# Patient Record
Sex: Female | Born: 1935 | Race: White | Hispanic: No
Health system: Southern US, Community
[De-identification: ages and names within clinical notes are randomized; demographics above are authoritative.]

---

## 2004-03-14 ENCOUNTER — Ambulatory Visit: Payer: Self-pay | Admitting: Urology

## 2004-06-25 ENCOUNTER — Inpatient Hospital Stay: Payer: Self-pay | Admitting: General Practice

## 2004-06-25 ENCOUNTER — Other Ambulatory Visit: Payer: Self-pay

## 2004-06-30 ENCOUNTER — Encounter: Payer: Self-pay | Admitting: Internal Medicine

## 2004-07-13 ENCOUNTER — Encounter: Payer: Self-pay | Admitting: Internal Medicine

## 2004-08-21 ENCOUNTER — Encounter: Payer: Self-pay | Admitting: General Practice

## 2004-09-12 ENCOUNTER — Encounter: Payer: Self-pay | Admitting: General Practice

## 2004-10-13 ENCOUNTER — Encounter: Payer: Self-pay | Admitting: General Practice

## 2005-04-11 ENCOUNTER — Ambulatory Visit: Payer: Self-pay | Admitting: Internal Medicine

## 2006-03-25 ENCOUNTER — Ambulatory Visit: Payer: Self-pay | Admitting: Unknown Physician Specialty

## 2006-07-25 ENCOUNTER — Ambulatory Visit: Payer: Self-pay | Admitting: Internal Medicine

## 2006-09-09 ENCOUNTER — Other Ambulatory Visit: Payer: Self-pay

## 2006-09-10 ENCOUNTER — Inpatient Hospital Stay: Payer: Self-pay | Admitting: Internal Medicine

## 2006-09-14 ENCOUNTER — Encounter: Payer: Self-pay | Admitting: Internal Medicine

## 2007-04-09 ENCOUNTER — Ambulatory Visit: Payer: Self-pay | Admitting: Internal Medicine

## 2007-05-30 ENCOUNTER — Emergency Department: Payer: Self-pay | Admitting: Emergency Medicine

## 2008-03-11 ENCOUNTER — Ambulatory Visit: Payer: Self-pay | Admitting: Internal Medicine

## 2009-05-04 ENCOUNTER — Ambulatory Visit: Payer: Self-pay | Admitting: Internal Medicine

## 2009-06-18 ENCOUNTER — Inpatient Hospital Stay: Payer: Self-pay | Admitting: Internal Medicine

## 2009-09-09 ENCOUNTER — Inpatient Hospital Stay: Payer: Self-pay | Admitting: Internal Medicine

## 2009-09-14 ENCOUNTER — Inpatient Hospital Stay: Payer: Self-pay | Admitting: Internal Medicine

## 2009-10-13 ENCOUNTER — Ambulatory Visit: Payer: Self-pay | Admitting: Internal Medicine

## 2009-10-14 ENCOUNTER — Inpatient Hospital Stay: Payer: Self-pay | Admitting: Internal Medicine

## 2009-10-20 ENCOUNTER — Encounter: Payer: Self-pay | Admitting: Internal Medicine

## 2009-11-12 ENCOUNTER — Ambulatory Visit: Payer: Self-pay | Admitting: Internal Medicine

## 2009-11-12 ENCOUNTER — Encounter: Payer: Self-pay | Admitting: Internal Medicine

## 2009-11-25 ENCOUNTER — Inpatient Hospital Stay: Payer: Self-pay | Admitting: Internal Medicine

## 2009-12-13 ENCOUNTER — Encounter: Payer: Self-pay | Admitting: Internal Medicine

## 2010-02-08 ENCOUNTER — Ambulatory Visit: Payer: Self-pay | Admitting: Internal Medicine

## 2010-04-16 ENCOUNTER — Emergency Department: Payer: Self-pay | Admitting: Emergency Medicine

## 2010-09-08 ENCOUNTER — Inpatient Hospital Stay: Payer: Self-pay | Admitting: Internal Medicine

## 2010-10-26 ENCOUNTER — Emergency Department: Payer: Self-pay | Admitting: Emergency Medicine

## 2011-07-22 ENCOUNTER — Emergency Department: Payer: Self-pay | Admitting: Emergency Medicine

## 2012-03-04 IMAGING — CR DG CHEST 1V PORT
1 series · 1 of 1 positions shown · non-contrast
Comparison: none

REASON FOR EXAM: chf/pneumonia
COMMENTS:

[view not recorded]
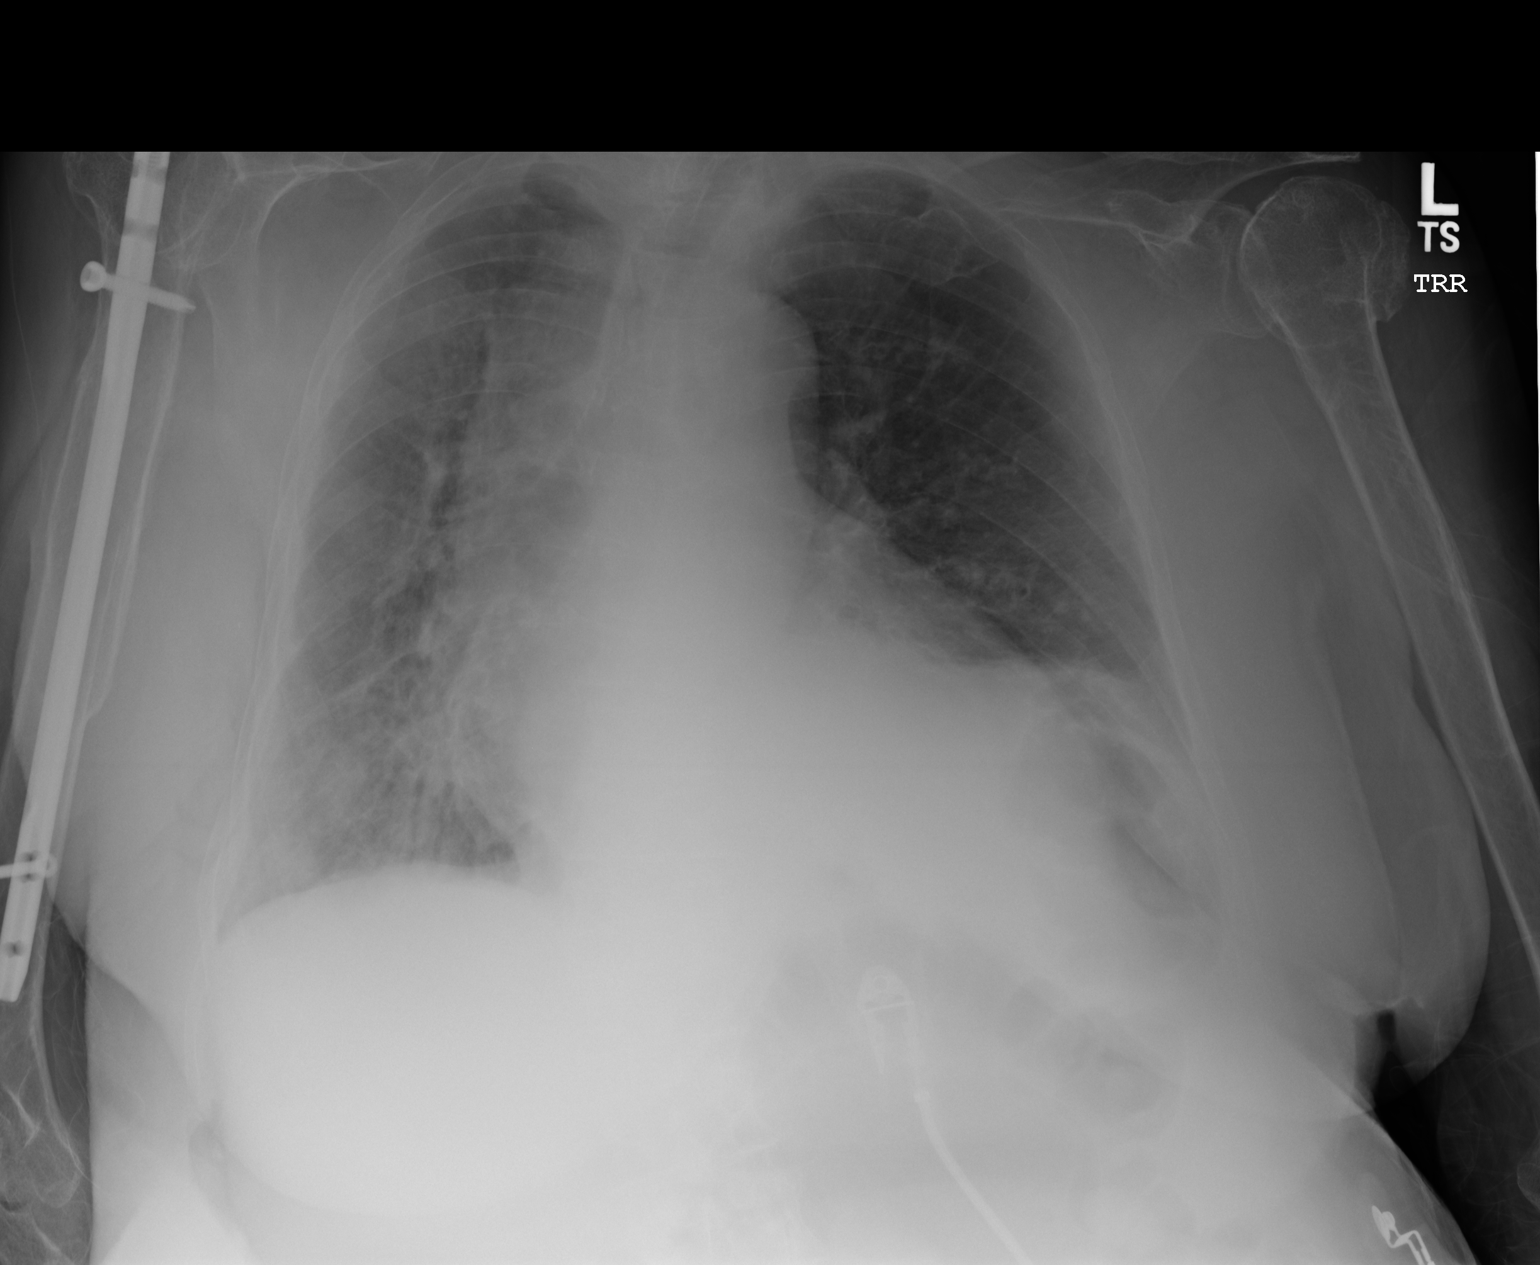

[1 of 1 positions shown; findings below may reference images not displayed]

PROCEDURE:     DXR - DXR PORTABLE CHEST SINGLE VIEW  - September 15, 2009  [DATE]

RESULT:     Comparison is made to study 14 September, 2009.

The lungs are adequately inflated. The right hemithorax remains more dense
than the left. Patchy density at the left lung base is present which may
reflect an elevated hemidiaphragm or partially intrathoracic stomach given
the gas configuration today and on yesterday's film. The cardiac silhouette
is enlarged. The pulmonary vascularity is not clearly engorged.
IMPRESSION: There is persistent increased density in the right lung in
an interstitial fashion. There is density at the left lung base which is
nonspecific and could reflect atelectasis or confluence of densities
associated with a hiatal hernia or partial intrathoracic stomach. A followup
PA and lateral chest x-ray would be of value.

## 2012-03-04 IMAGING — CR DG CHEST 2V
1 series · 2 of 2 positions shown · non-contrast
Comparison: none

REASON FOR EXAM: hypoxia
COMMENTS:

[Series 1: view not recorded · 0.17mm/px · 2 of 2 slices shown]
[im 1/2]
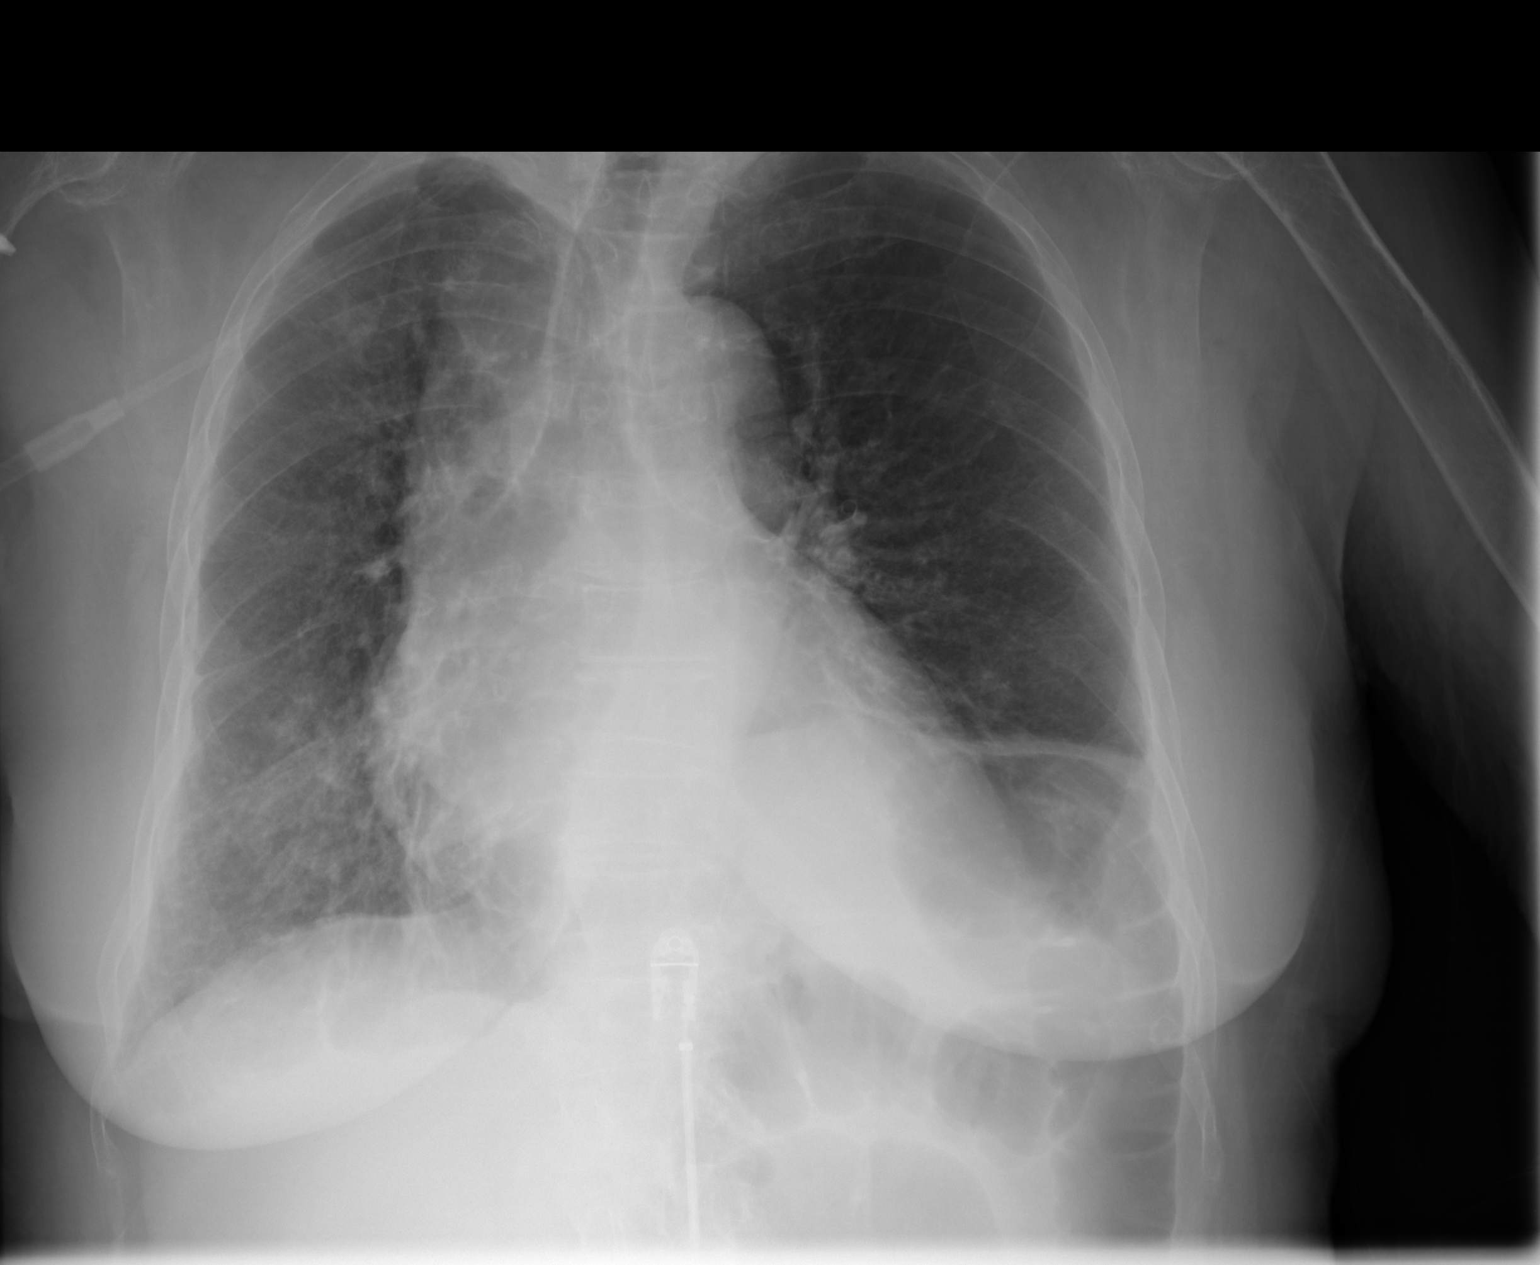
[im 2/2]
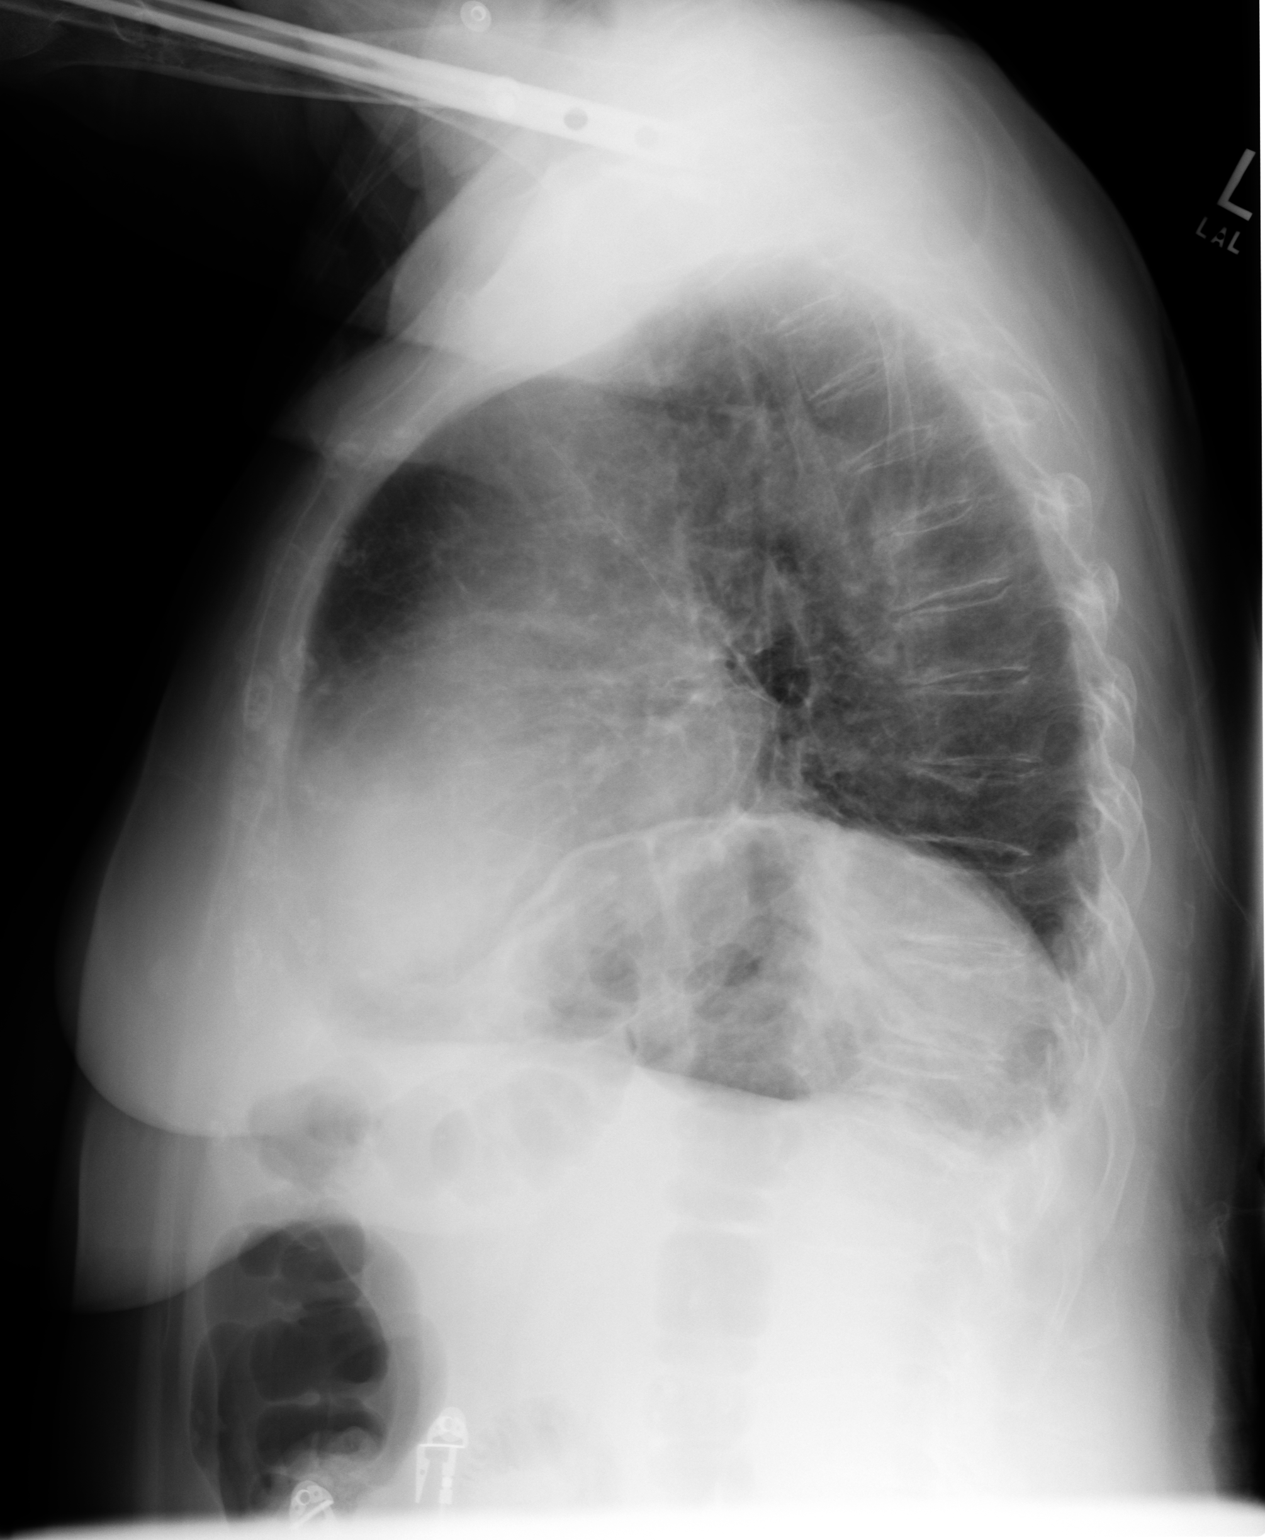

[2 of 2 positions shown; findings below may reference images not displayed]

PROCEDURE:     DXR - DXR CHEST PA (OR AP) AND LATERAL  - September 15, 2009  [DATE]

RESULT:     Comparison is made to a prior exam of earlier this day.

There persists mild thickening of the interstitial lung markings on the
right, improved as compared to the exam of 09/14/2009 and stable as compared
to the exam of earlier this day. No new infiltrates are seen. The heart is
mildly enlarged but stable as compared to prior examinations. No pleural
effusion or frank pulmonary edema is identified.
IMPRESSION: 1.  There has been improvement in the previously noted diffuse interstitial
density on the right.
2.  No new infiltrates are seen.
3.  There is chronic elevation of the left hemidiaphragm.
4.  Stable cardiomegaly.

## 2012-06-27 ENCOUNTER — Emergency Department: Payer: Self-pay | Admitting: Emergency Medicine

## 2012-06-27 LAB — CBC
HCT: 33 % — ABNORMAL LOW (ref 35.0–47.0)
MCH: 32.8 pg (ref 26.0–34.0)
MCHC: 36 g/dL (ref 32.0–36.0)
MCV: 91 fL (ref 80–100)
RBC: 3.62 10*6/uL — ABNORMAL LOW (ref 3.80–5.20)
WBC: 7.2 10*3/uL (ref 3.6–11.0)

## 2012-06-27 LAB — COMPREHENSIVE METABOLIC PANEL
Albumin: 3.6 g/dL (ref 3.4–5.0)
Alkaline Phosphatase: 73 U/L (ref 50–136)
BUN: 9 mg/dL (ref 7–18)
Bilirubin,Total: 0.4 mg/dL (ref 0.2–1.0)
Co2: 26 mmol/L (ref 21–32)
EGFR (African American): 60 — ABNORMAL LOW
EGFR (Non-African Amer.): 52 — ABNORMAL LOW
Potassium: 4.3 mmol/L (ref 3.5–5.1)
SGOT(AST): 27 U/L (ref 15–37)
SGPT (ALT): 14 U/L (ref 12–78)
Sodium: 130 mmol/L — ABNORMAL LOW (ref 136–145)

## 2012-06-27 LAB — URINALYSIS, COMPLETE
Blood: NEGATIVE
Ketone: NEGATIVE
RBC,UR: 1 /HPF (ref 0–5)
Squamous Epithelial: <1
WBC UR: 18 /HPF (ref 0–5)

## 2012-07-25 ENCOUNTER — Telehealth: Payer: Self-pay | Admitting: *Deleted

## 2012-07-25 NOTE — Telephone Encounter (Signed)
Error

## 2012-07-28 IMAGING — RF DG UGI W/O KUB
1 series · 13 of 13 positions shown · non-contrast
Comparison: none

REASON FOR EXAM: epigastric pain
COMMENTS:

[Series 1: run · 13 of 13 slices shown]
[im 1/13]
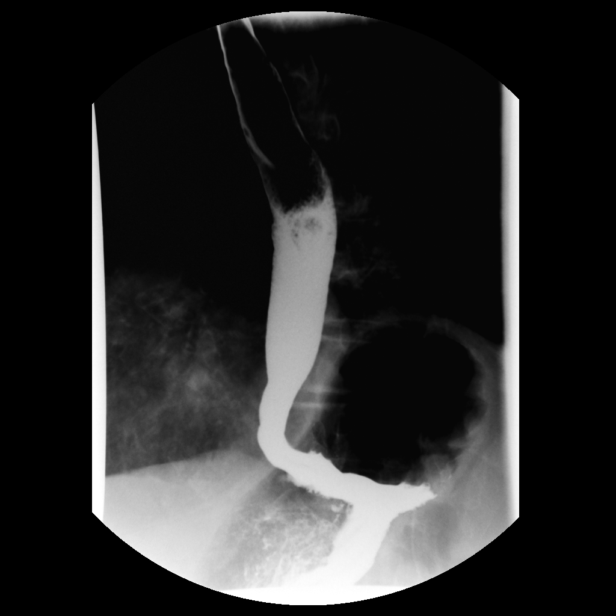
[im 2/13]
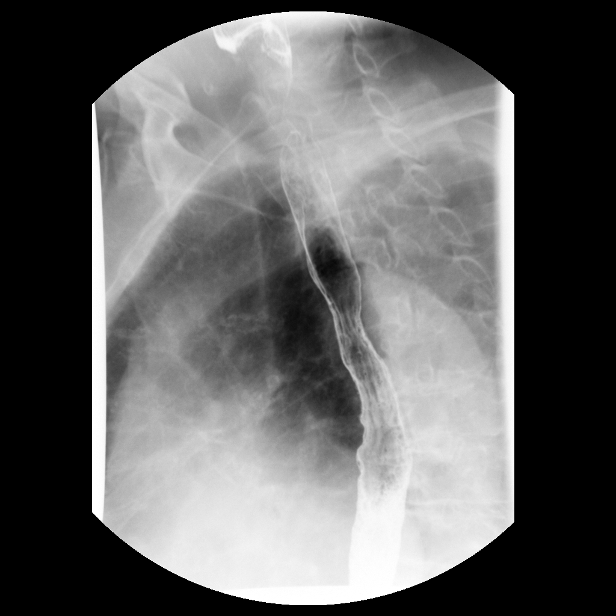
[im 3/13]
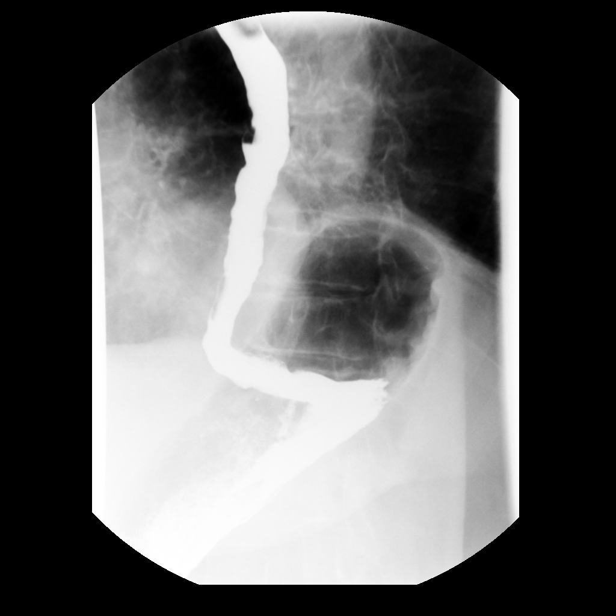
[im 4/13]
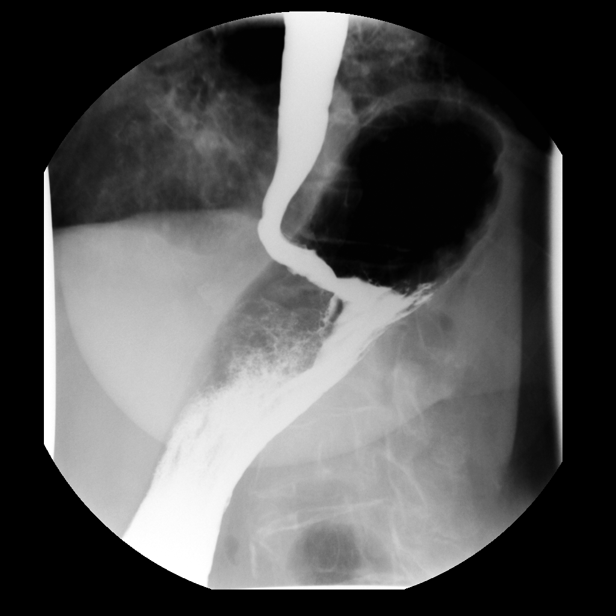
[im 5/13]
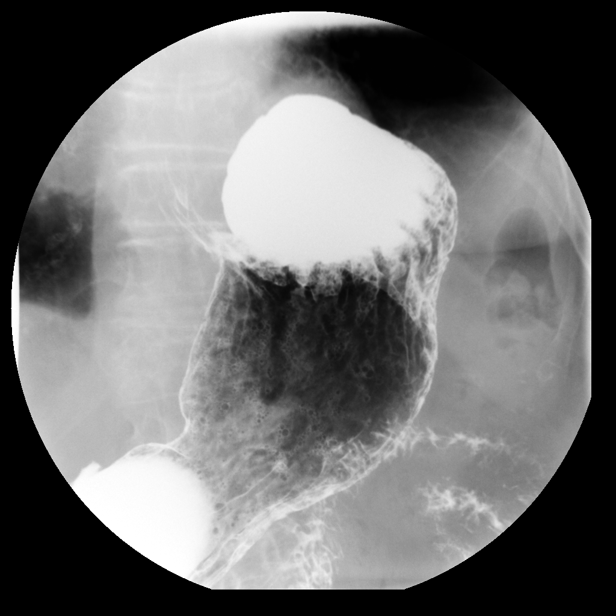
[im 6/13]
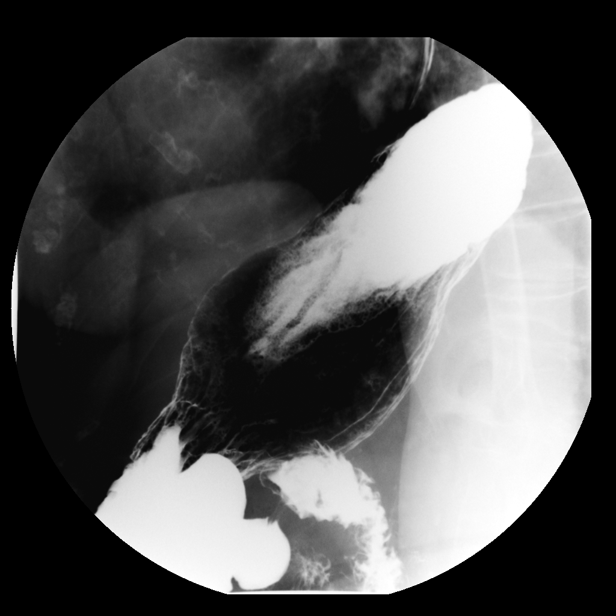
[im 7/13]
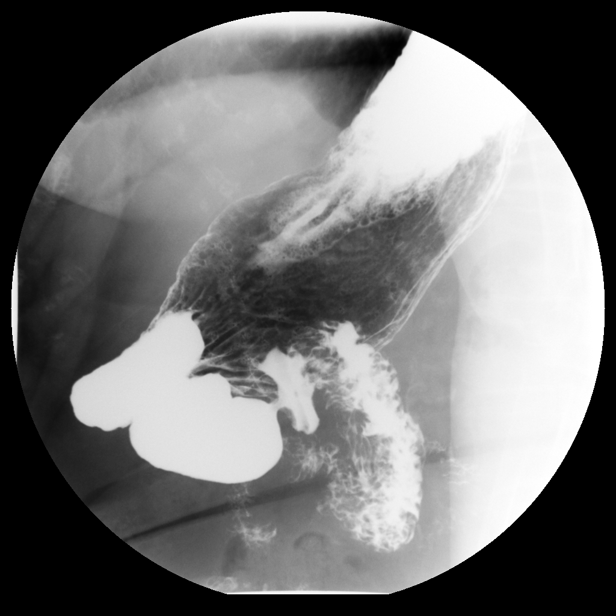
[im 8/13]
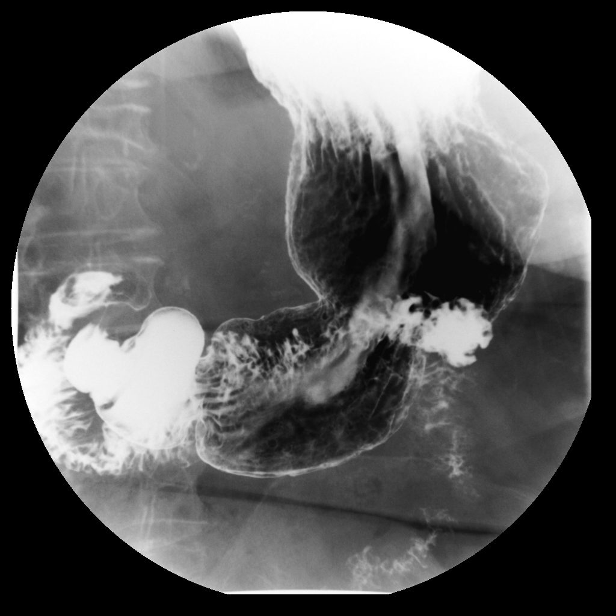
[im 9/13]
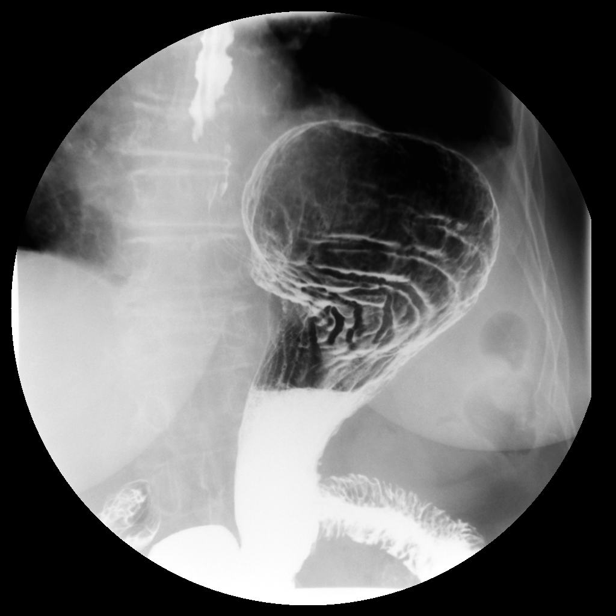
[im 10/13]
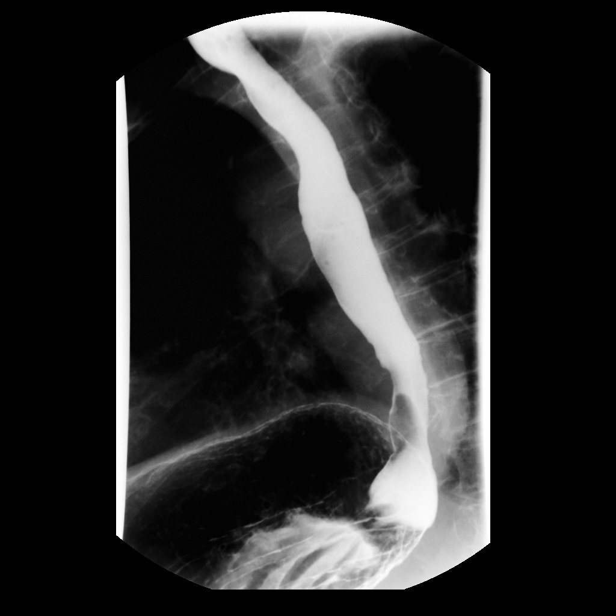
[im 11/13]
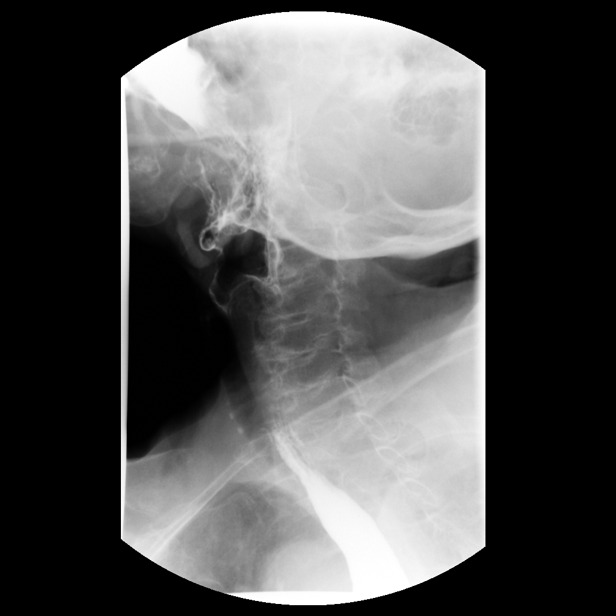
[im 12/13]
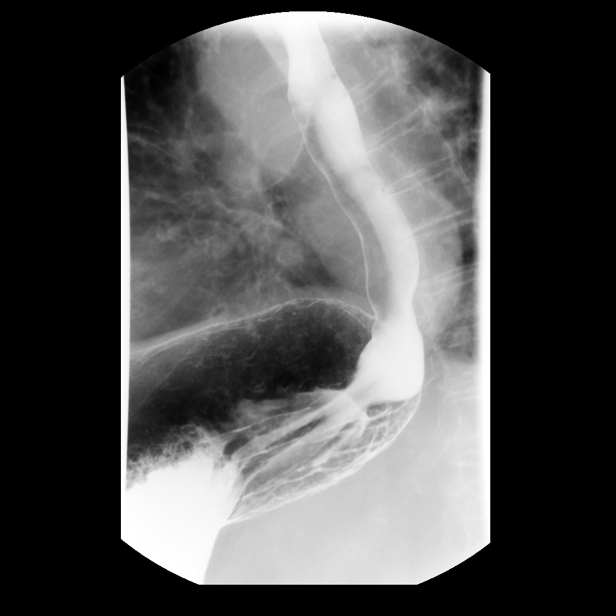
[im 13/13]
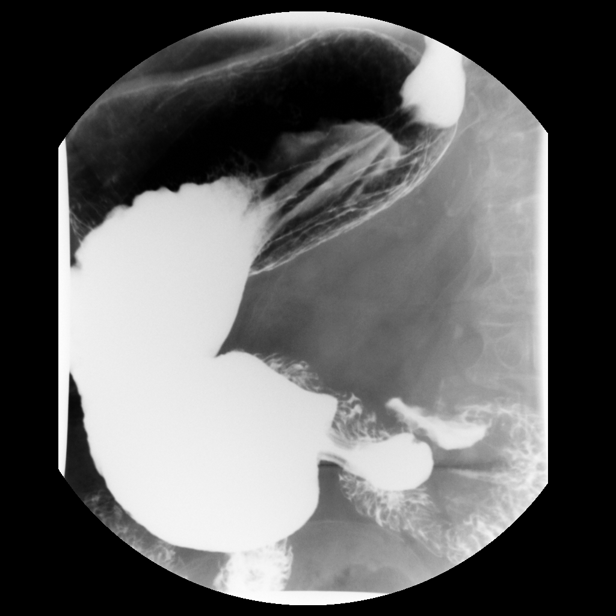

[13 of 13 positions shown; findings below may reference images not displayed]

PROCEDURE:     FL  - FL UPPER GI  - February 08, 2010  [DATE]

RESULT:     The anticipated procedure was discussed with Ms. Reynoso. She
voiced her willingness to proceed. The patient ingested barium without
difficulty. There was no laryngeal penetration of the barium. The thoracic
esophagus demonstrated very mild changes of presbyesophagus but no evidence
of stricture or esophagitis or reflux. No hiatal hernia was demonstrated.

The stomach distended well. The gastric mucosal fold pattern appeared
normal. The duodenal bulb and C-sweep were normal in appearance. The 12 mm
barium pill passed without difficulty.
IMPRESSION: 1. There are very mild changes of presbyesophagus. There is no evidence of a
stricture nor esophagitis.
2. The stomach and duodenum are normal in appearance for age.

## 2012-08-05 ENCOUNTER — Ambulatory Visit: Payer: Self-pay | Admitting: Neurology

## 2013-08-12 ENCOUNTER — Ambulatory Visit
Admit: 2013-08-12 | Disposition: A | Discharge status: Home / Self Care | Payer: Self-pay | Attending: Nurse Practitioner | Admitting: Nurse Practitioner

## 2013-08-12 ENCOUNTER — Ambulatory Visit: Payer: Self-pay | Admitting: Internal Medicine

## 2013-08-14 ENCOUNTER — Inpatient Hospital Stay: Payer: Self-pay | Admitting: Internal Medicine

## 2013-08-14 LAB — CBC
HCT: 29.5 % — ABNORMAL LOW (ref 35.0–47.0)
HGB: 10.3 g/dL — ABNORMAL LOW (ref 12.0–16.0)
MCH: 32.2 pg (ref 26.0–34.0)
MCHC: 35 g/dL (ref 32.0–36.0)
MCV: 92 fL (ref 80–100)
Platelet: 171 10*3/uL (ref 150–440)
RBC: 3.2 10*6/uL — ABNORMAL LOW (ref 3.80–5.20)
RDW: 14.1 % (ref 11.5–14.5)
WBC: 5.1 10*3/uL (ref 3.6–11.0)

## 2013-08-14 LAB — TSH: THYROID STIMULATING HORM: 4.76 u[IU]/mL — AB

## 2013-08-14 LAB — COMPREHENSIVE METABOLIC PANEL
ALBUMIN: 3.4 g/dL (ref 3.4–5.0)
ALK PHOS: 62 U/L
AST: 18 U/L (ref 15–37)
Anion Gap: 8 (ref 7–16)
BILIRUBIN TOTAL: 0.4 mg/dL (ref 0.2–1.0)
BUN: 14 mg/dL (ref 7–18)
CALCIUM: 8.3 mg/dL — AB (ref 8.5–10.1)
CHLORIDE: 98 mmol/L (ref 98–107)
Co2: 27 mmol/L (ref 21–32)
Creatinine: 1.34 mg/dL — ABNORMAL HIGH (ref 0.60–1.30)
EGFR (African American): 43 — ABNORMAL LOW
GFR CALC NON AF AMER: 37 — AB
GLUCOSE: 119 mg/dL — AB (ref 65–99)
Osmolality: 268 (ref 275–301)
POTASSIUM: 4.1 mmol/L (ref 3.5–5.1)
SGPT (ALT): 12 U/L (ref 12–78)
SODIUM: 133 mmol/L — AB (ref 136–145)
TOTAL PROTEIN: 6.3 g/dL — AB (ref 6.4–8.2)

## 2013-08-14 LAB — PROTIME-INR
INR: 0.9
PROTHROMBIN TIME: 12.5 s (ref 11.5–14.7)

## 2013-08-14 LAB — URINALYSIS, COMPLETE
BILIRUBIN, UR: NEGATIVE
Bacteria: NEGATIVE
Blood: NEGATIVE
Glucose,UR: NEGATIVE mg/dL (ref 0–75)
Ketone: NEGATIVE
Leukocyte Esterase: NEGATIVE
Nitrite: NEGATIVE
PH: 7 (ref 4.5–8.0)
Protein: NEGATIVE
RBC,UR: NONE SEEN /HPF (ref 0–5)
Specific Gravity: 1.005 (ref 1.003–1.030)

## 2013-08-14 LAB — MAGNESIUM: Magnesium: 1.8 mg/dL

## 2013-08-14 LAB — APTT: Activated PTT: 23.3 secs — ABNORMAL LOW (ref 23.6–35.9)

## 2013-08-15 LAB — CBC WITH DIFFERENTIAL/PLATELET
Basophil #: 0 10*3/uL (ref 0.0–0.1)
Basophil %: 0.3 %
EOS ABS: 0 10*3/uL (ref 0.0–0.7)
Eosinophil %: 0 %
HCT: 27.9 % — ABNORMAL LOW (ref 35.0–47.0)
HGB: 9.6 g/dL — ABNORMAL LOW (ref 12.0–16.0)
LYMPHS PCT: 8 %
Lymphocyte #: 0.7 10*3/uL — ABNORMAL LOW (ref 1.0–3.6)
MCH: 32 pg (ref 26.0–34.0)
MCHC: 34.2 g/dL (ref 32.0–36.0)
MCV: 93 fL (ref 80–100)
MONO ABS: 1.2 x10 3/mm — AB (ref 0.2–0.9)
Monocyte %: 13.6 %
NEUTROS ABS: 6.8 10*3/uL — AB (ref 1.4–6.5)
Neutrophil %: 78.1 %
Platelet: 98 10*3/uL — ABNORMAL LOW (ref 150–440)
RBC: 2.99 10*6/uL — ABNORMAL LOW (ref 3.80–5.20)
RDW: 14.4 % (ref 11.5–14.5)
WBC: 8.7 10*3/uL (ref 3.6–11.0)

## 2013-08-15 LAB — BASIC METABOLIC PANEL
Anion Gap: 9 (ref 7–16)
BUN: 12 mg/dL (ref 7–18)
CALCIUM: 7.2 mg/dL — AB (ref 8.5–10.1)
CHLORIDE: 108 mmol/L — AB (ref 98–107)
Co2: 22 mmol/L (ref 21–32)
Creatinine: 1.27 mg/dL (ref 0.60–1.30)
EGFR (African American): 46 — ABNORMAL LOW
GFR CALC NON AF AMER: 39 — AB
Glucose: 119 mg/dL — ABNORMAL HIGH (ref 65–99)
Osmolality: 278 (ref 275–301)
POTASSIUM: 4.5 mmol/L (ref 3.5–5.1)
Sodium: 139 mmol/L (ref 136–145)

## 2013-08-15 LAB — HEMOGLOBIN: HGB: 6.3 g/dL — AB (ref 12.0–16.0)

## 2013-08-15 LAB — URINE CULTURE

## 2013-08-16 LAB — BASIC METABOLIC PANEL
ANION GAP: 7 (ref 7–16)
BUN: 11 mg/dL (ref 7–18)
CALCIUM: 7.3 mg/dL — AB (ref 8.5–10.1)
CHLORIDE: 109 mmol/L — AB (ref 98–107)
CO2: 23 mmol/L (ref 21–32)
Creatinine: 1.22 mg/dL (ref 0.60–1.30)
EGFR (African American): 48 — ABNORMAL LOW
GFR CALC NON AF AMER: 41 — AB
Glucose: 99 mg/dL (ref 65–99)
Osmolality: 277 (ref 275–301)
Potassium: 4.2 mmol/L (ref 3.5–5.1)
Sodium: 139 mmol/L (ref 136–145)

## 2013-08-16 LAB — PLATELET COUNT: Platelet: 84 x10 3/mm 3 — ABNORMAL LOW

## 2013-08-16 LAB — HEMOGLOBIN: HGB: 8 g/dL — AB (ref 12.0–16.0)

## 2013-08-17 LAB — CBC WITH DIFFERENTIAL/PLATELET
Basophil #: 0 10*3/uL (ref 0.0–0.1)
Basophil %: 0.3 %
EOS PCT: 0.4 %
Eosinophil #: 0 10*3/uL (ref 0.0–0.7)
HCT: 22.5 % — ABNORMAL LOW (ref 35.0–47.0)
HGB: 7.7 g/dL — AB (ref 12.0–16.0)
LYMPHS PCT: 9.3 %
Lymphocyte #: 0.8 10*3/uL — ABNORMAL LOW (ref 1.0–3.6)
MCH: 32.1 pg (ref 26.0–34.0)
MCHC: 34.2 g/dL (ref 32.0–36.0)
MCV: 94 fL (ref 80–100)
MONO ABS: 1.3 x10 3/mm — AB (ref 0.2–0.9)
MONOS PCT: 15.3 %
Neutrophil #: 6.2 10*3/uL (ref 1.4–6.5)
Neutrophil %: 74.7 %
Platelet: 96 10*3/uL — ABNORMAL LOW (ref 150–440)
RBC: 2.4 10*6/uL — ABNORMAL LOW (ref 3.80–5.20)
RDW: 14.8 % — AB (ref 11.5–14.5)
WBC: 8.3 10*3/uL (ref 3.6–11.0)

## 2013-08-17 LAB — BASIC METABOLIC PANEL
ANION GAP: 8 (ref 7–16)
BUN: 8 mg/dL (ref 7–18)
CO2: 21 mmol/L (ref 21–32)
CREATININE: 1.24 mg/dL (ref 0.60–1.30)
Calcium, Total: 7.5 mg/dL — ABNORMAL LOW (ref 8.5–10.1)
Chloride: 108 mmol/L — ABNORMAL HIGH (ref 98–107)
EGFR (Non-African Amer.): 40 — ABNORMAL LOW
GFR CALC AF AMER: 47 — AB
Glucose: 105 mg/dL — ABNORMAL HIGH (ref 65–99)
OSMOLALITY: 273 (ref 275–301)
Potassium: 3.8 mmol/L (ref 3.5–5.1)
SODIUM: 137 mmol/L (ref 136–145)

## 2013-08-18 LAB — CBC WITH DIFFERENTIAL/PLATELET
BASOS ABS: 0 10*3/uL (ref 0.0–0.1)
BASOS PCT: 0.4 %
EOS PCT: 1.4 %
Eosinophil #: 0.1 10*3/uL (ref 0.0–0.7)
HCT: 25.1 % — ABNORMAL LOW (ref 35.0–47.0)
HGB: 8.7 g/dL — ABNORMAL LOW (ref 12.0–16.0)
LYMPHS ABS: 0.7 10*3/uL — AB (ref 1.0–3.6)
LYMPHS PCT: 9.2 %
MCH: 32.5 pg (ref 26.0–34.0)
MCHC: 34.8 g/dL (ref 32.0–36.0)
MCV: 94 fL (ref 80–100)
Monocyte #: 1 x10 3/mm — ABNORMAL HIGH (ref 0.2–0.9)
Monocyte %: 13.3 %
NEUTROS PCT: 75.7 %
Neutrophil #: 5.6 10*3/uL (ref 1.4–6.5)
PLATELETS: 101 10*3/uL — AB (ref 150–440)
RBC: 2.69 10*6/uL — AB (ref 3.80–5.20)
RDW: 14.7 % — ABNORMAL HIGH (ref 11.5–14.5)
WBC: 7.4 10*3/uL (ref 3.6–11.0)

## 2013-08-18 LAB — BASIC METABOLIC PANEL
ANION GAP: 8 (ref 7–16)
BUN: 10 mg/dL (ref 7–18)
CO2: 22 mmol/L (ref 21–32)
CREATININE: 1.03 mg/dL (ref 0.60–1.30)
Calcium, Total: 7.6 mg/dL — ABNORMAL LOW (ref 8.5–10.1)
Chloride: 109 mmol/L — ABNORMAL HIGH (ref 98–107)
EGFR (African American): 59 — ABNORMAL LOW
EGFR (Non-African Amer.): 51 — ABNORMAL LOW
Glucose: 100 mg/dL — ABNORMAL HIGH (ref 65–99)
OSMOLALITY: 277 (ref 275–301)
POTASSIUM: 3.9 mmol/L (ref 3.5–5.1)
SODIUM: 139 mmol/L (ref 136–145)

## 2013-08-19 LAB — CBC WITH DIFFERENTIAL/PLATELET
Basophil #: 0 10*3/uL (ref 0.0–0.1)
Basophil %: 0.6 %
EOS PCT: 2.7 %
Eosinophil #: 0.1 10*3/uL (ref 0.0–0.7)
HCT: 25.6 % — AB (ref 35.0–47.0)
HGB: 8.5 g/dL — AB (ref 12.0–16.0)
LYMPHS PCT: 11.3 %
Lymphocyte #: 0.6 10*3/uL — ABNORMAL LOW (ref 1.0–3.6)
MCH: 31.1 pg (ref 26.0–34.0)
MCHC: 33.3 g/dL (ref 32.0–36.0)
MCV: 93 fL (ref 80–100)
MONO ABS: 0.8 x10 3/mm (ref 0.2–0.9)
Monocyte %: 15.2 %
NEUTROS ABS: 3.8 10*3/uL (ref 1.4–6.5)
NEUTROS PCT: 70.2 %
Platelet: 127 10*3/uL — ABNORMAL LOW (ref 150–440)
RBC: 2.74 10*6/uL — ABNORMAL LOW (ref 3.80–5.20)
RDW: 15 % — AB (ref 11.5–14.5)
WBC: 5.4 10*3/uL (ref 3.6–11.0)

## 2013-08-19 LAB — BASIC METABOLIC PANEL
ANION GAP: 10 (ref 7–16)
BUN: 10 mg/dL (ref 7–18)
Calcium, Total: 7.8 mg/dL — ABNORMAL LOW (ref 8.5–10.1)
Chloride: 108 mmol/L — ABNORMAL HIGH (ref 98–107)
Co2: 20 mmol/L — ABNORMAL LOW (ref 21–32)
Creatinine: 0.89 mg/dL (ref 0.60–1.30)
EGFR (African American): >60
EGFR (Non-African Amer.): >60
Glucose: 94 mg/dL (ref 65–99)
Osmolality: 274 (ref 275–301)
POTASSIUM: 3.9 mmol/L (ref 3.5–5.1)
Sodium: 138 mmol/L (ref 136–145)

## 2013-08-20 ENCOUNTER — Encounter: Payer: Self-pay | Admitting: Internal Medicine

## 2013-08-20 LAB — CBC WITH DIFFERENTIAL/PLATELET
BASOS ABS: 0 10*3/uL (ref 0.0–0.1)
BASOS PCT: 0.4 %
EOS ABS: 0.2 10*3/uL (ref 0.0–0.7)
EOS PCT: 3.6 %
HCT: 25.7 % — AB (ref 35.0–47.0)
HGB: 8.5 g/dL — AB (ref 12.0–16.0)
Lymphocyte #: 0.5 10*3/uL — ABNORMAL LOW (ref 1.0–3.6)
Lymphocyte %: 10.2 %
MCH: 31.2 pg (ref 26.0–34.0)
MCHC: 33.1 g/dL (ref 32.0–36.0)
MCV: 94 fL (ref 80–100)
MONOS PCT: 18.2 %
Monocyte #: 0.9 x10 3/mm (ref 0.2–0.9)
Neutrophil #: 3.2 10*3/uL (ref 1.4–6.5)
Neutrophil %: 67.6 %
Platelet: 145 10*3/uL — ABNORMAL LOW (ref 150–440)
RBC: 2.73 10*6/uL — AB (ref 3.80–5.20)
RDW: 15.1 % — ABNORMAL HIGH (ref 11.5–14.5)
WBC: 4.7 10*3/uL (ref 3.6–11.0)

## 2013-08-20 LAB — BASIC METABOLIC PANEL
Anion Gap: 5 — ABNORMAL LOW (ref 7–16)
BUN: 8 mg/dL (ref 7–18)
CALCIUM: 7.6 mg/dL — AB (ref 8.5–10.1)
CO2: 25 mmol/L (ref 21–32)
Chloride: 109 mmol/L — ABNORMAL HIGH (ref 98–107)
Creatinine: 1 mg/dL (ref 0.60–1.30)
EGFR (African American): >60
EGFR (Non-African Amer.): 52 — ABNORMAL LOW
Glucose: 104 mg/dL — ABNORMAL HIGH (ref 65–99)
Osmolality: 276 (ref 275–301)
Potassium: 4.1 mmol/L (ref 3.5–5.1)
Sodium: 139 mmol/L (ref 136–145)

## 2013-08-26 LAB — URINALYSIS, COMPLETE
Bacteria: NONE SEEN
Bilirubin,UR: NEGATIVE
GLUCOSE, UR: NEGATIVE mg/dL (ref 0–75)
Ketone: NEGATIVE
Nitrite: NEGATIVE
Ph: 8 (ref 4.5–8.0)
Protein: NEGATIVE
RBC,UR: 1 /HPF (ref 0–5)
SPECIFIC GRAVITY: 1.008 (ref 1.003–1.030)
SQUAMOUS EPITHELIAL: NONE SEEN
WBC UR: 196 /HPF (ref 0–5)

## 2013-08-27 LAB — CBC WITH DIFFERENTIAL/PLATELET
BASOS ABS: 0 10*3/uL (ref 0.0–0.1)
Basophil %: 0.3 %
EOS PCT: 2.9 %
Eosinophil #: 0.2 10*3/uL (ref 0.0–0.7)
HCT: 29.6 % — ABNORMAL LOW (ref 35.0–47.0)
HGB: 9.8 g/dL — ABNORMAL LOW (ref 12.0–16.0)
Lymphocyte #: 0.5 10*3/uL — ABNORMAL LOW (ref 1.0–3.6)
Lymphocyte %: 8.6 %
MCH: 31.8 pg (ref 26.0–34.0)
MCHC: 33 g/dL (ref 32.0–36.0)
MCV: 96 fL (ref 80–100)
MONOS PCT: 12.7 %
Monocyte #: 0.7 x10 3/mm (ref 0.2–0.9)
NEUTROS ABS: 4.4 10*3/uL (ref 1.4–6.5)
Neutrophil %: 75.5 %
Platelet: 294 10*3/uL (ref 150–440)
RBC: 3.07 10*6/uL — ABNORMAL LOW (ref 3.80–5.20)
RDW: 15.8 % — AB (ref 11.5–14.5)
WBC: 5.8 10*3/uL (ref 3.6–11.0)

## 2013-08-27 LAB — BASIC METABOLIC PANEL
ANION GAP: 5 — AB (ref 7–16)
BUN: 9 mg/dL (ref 7–18)
CREATININE: 1.15 mg/dL (ref 0.60–1.30)
Calcium, Total: 8.3 mg/dL — ABNORMAL LOW (ref 8.5–10.1)
Chloride: 98 mmol/L (ref 98–107)
Co2: 31 mmol/L (ref 21–32)
EGFR (African American): 51 — ABNORMAL LOW
EGFR (Non-African Amer.): 44 — ABNORMAL LOW
GLUCOSE: 90 mg/dL (ref 65–99)
Osmolality: 266 (ref 275–301)
Potassium: 3.9 mmol/L (ref 3.5–5.1)
SODIUM: 134 mmol/L — AB (ref 136–145)

## 2013-08-28 LAB — URINE CULTURE

## 2013-09-10 LAB — CBC WITH DIFFERENTIAL/PLATELET
BASOS ABS: 0 10*3/uL (ref 0.0–0.1)
BASOS PCT: 0.3 %
EOS ABS: 0.2 10*3/uL (ref 0.0–0.7)
EOS PCT: 2.1 %
HCT: 31.2 % — ABNORMAL LOW (ref 35.0–47.0)
HGB: 10.4 g/dL — ABNORMAL LOW (ref 12.0–16.0)
LYMPHS PCT: 13.8 %
Lymphocyte #: 1 10*3/uL (ref 1.0–3.6)
MCH: 31.5 pg (ref 26.0–34.0)
MCHC: 33.4 g/dL (ref 32.0–36.0)
MCV: 94 fL (ref 80–100)
MONOS PCT: 11.9 %
Monocyte #: 0.9 x10 3/mm (ref 0.2–0.9)
Neutrophil #: 5.3 10*3/uL (ref 1.4–6.5)
Neutrophil %: 71.9 %
Platelet: 260 10*3/uL (ref 150–440)
RBC: 3.31 10*6/uL — ABNORMAL LOW (ref 3.80–5.20)
RDW: 14.8 % — ABNORMAL HIGH (ref 11.5–14.5)
WBC: 7.5 10*3/uL (ref 3.6–11.0)

## 2013-09-12 ENCOUNTER — Ambulatory Visit
Admit: 2013-09-12 | Disposition: A | Discharge status: Home / Self Care | Payer: Self-pay | Attending: Nurse Practitioner | Admitting: Nurse Practitioner

## 2013-09-12 ENCOUNTER — Ambulatory Visit: Payer: Self-pay | Admitting: Internal Medicine

## 2013-09-12 ENCOUNTER — Encounter: Payer: Self-pay | Admitting: Internal Medicine

## 2013-09-22 ENCOUNTER — Emergency Department: Payer: Self-pay | Admitting: Emergency Medicine

## 2013-09-22 LAB — CBC WITH DIFFERENTIAL/PLATELET
Basophil #: 0 10*3/uL (ref 0.0–0.1)
Basophil %: 0.2 %
Eosinophil #: 0.2 10*3/uL (ref 0.0–0.7)
Eosinophil %: 2.9 %
HCT: 31.1 % — ABNORMAL LOW (ref 35.0–47.0)
HGB: 10.5 g/dL — AB (ref 12.0–16.0)
LYMPHS ABS: 0.8 10*3/uL — AB (ref 1.0–3.6)
Lymphocyte %: 12.2 %
MCH: 31.6 pg (ref 26.0–34.0)
MCHC: 33.9 g/dL (ref 32.0–36.0)
MCV: 93 fL (ref 80–100)
Monocyte #: 0.7 x10 3/mm (ref 0.2–0.9)
Monocyte %: 11.4 %
NEUTROS ABS: 4.7 10*3/uL (ref 1.4–6.5)
Neutrophil %: 73.3 %
Platelet: 157 10*3/uL (ref 150–440)
RBC: 3.34 10*6/uL — ABNORMAL LOW (ref 3.80–5.20)
RDW: 14.6 % — ABNORMAL HIGH (ref 11.5–14.5)
WBC: 6.4 10*3/uL (ref 3.6–11.0)

## 2013-09-22 LAB — BASIC METABOLIC PANEL
ANION GAP: 7 (ref 7–16)
BUN: 20 mg/dL — ABNORMAL HIGH (ref 7–18)
CHLORIDE: 98 mmol/L (ref 98–107)
Calcium, Total: 8.8 mg/dL (ref 8.5–10.1)
Co2: 30 mmol/L (ref 21–32)
Creatinine: 1.15 mg/dL (ref 0.60–1.30)
EGFR (African American): 51 — ABNORMAL LOW
EGFR (Non-African Amer.): 44 — ABNORMAL LOW
Glucose: 101 mg/dL — ABNORMAL HIGH (ref 65–99)
OSMOLALITY: 273 (ref 275–301)
Potassium: 4.6 mmol/L (ref 3.5–5.1)
Sodium: 135 mmol/L — ABNORMAL LOW (ref 136–145)

## 2013-09-25 LAB — COMPREHENSIVE METABOLIC PANEL
ALBUMIN: 3.1 g/dL — AB (ref 3.4–5.0)
ALK PHOS: 130 U/L — AB
ALT: 11 U/L — AB
Anion Gap: 5 — ABNORMAL LOW (ref 7–16)
BILIRUBIN TOTAL: 0.4 mg/dL (ref 0.2–1.0)
BUN: 18 mg/dL (ref 7–18)
Calcium, Total: 8.1 mg/dL — ABNORMAL LOW (ref 8.5–10.1)
Chloride: 100 mmol/L (ref 98–107)
Co2: 32 mmol/L (ref 21–32)
Creatinine: 1.12 mg/dL (ref 0.60–1.30)
GFR CALC AF AMER: 53 — AB
GFR CALC NON AF AMER: 46 — AB
GLUCOSE: 107 mg/dL — AB (ref 65–99)
OSMOLALITY: 276 (ref 275–301)
Potassium: 4.8 mmol/L (ref 3.5–5.1)
SGOT(AST): 10 U/L — ABNORMAL LOW (ref 15–37)
SODIUM: 137 mmol/L (ref 136–145)
Total Protein: 5.5 g/dL — ABNORMAL LOW (ref 6.4–8.2)

## 2013-09-25 LAB — CBC WITH DIFFERENTIAL/PLATELET
BASOS ABS: 0 10*3/uL (ref 0.0–0.1)
Basophil %: 0.1 %
Eosinophil #: 0.2 10*3/uL (ref 0.0–0.7)
Eosinophil %: 4.3 %
HCT: 29.3 % — ABNORMAL LOW (ref 35.0–47.0)
HGB: 9.6 g/dL — ABNORMAL LOW (ref 12.0–16.0)
Lymphocyte #: 0.8 10*3/uL — ABNORMAL LOW (ref 1.0–3.6)
Lymphocyte %: 14.9 %
MCH: 31.2 pg (ref 26.0–34.0)
MCHC: 32.6 g/dL (ref 32.0–36.0)
MCV: 96 fL (ref 80–100)
MONOS PCT: 14 %
Monocyte #: 0.7 x10 3/mm (ref 0.2–0.9)
NEUTROS ABS: 3.4 10*3/uL (ref 1.4–6.5)
Neutrophil %: 66.7 %
Platelet: 155 10*3/uL (ref 150–440)
RBC: 3.07 10*6/uL — ABNORMAL LOW (ref 3.80–5.20)
RDW: 15 % — ABNORMAL HIGH (ref 11.5–14.5)
WBC: 5.1 10*3/uL (ref 3.6–11.0)

## 2013-09-25 LAB — URINALYSIS, COMPLETE
BLOOD: NEGATIVE
Bacteria: NONE SEEN
Bilirubin,UR: NEGATIVE
Glucose,UR: NEGATIVE mg/dL (ref 0–75)
Ketone: NEGATIVE
Leukocyte Esterase: NEGATIVE
Nitrite: NEGATIVE
Ph: 7 (ref 4.5–8.0)
Protein: NEGATIVE
SQUAMOUS EPITHELIAL: NONE SEEN
Specific Gravity: 1.016 (ref 1.003–1.030)
WBC UR: NONE SEEN /HPF (ref 0–5)

## 2013-09-27 LAB — URINE CULTURE

## 2013-10-13 ENCOUNTER — Encounter: Payer: Self-pay | Admitting: Internal Medicine

## 2013-10-13 ENCOUNTER — Ambulatory Visit
Admit: 2013-10-13 | Disposition: A | Discharge status: Home / Self Care | Payer: Self-pay | Attending: Nurse Practitioner | Admitting: Nurse Practitioner

## 2013-11-12 ENCOUNTER — Encounter: Payer: Self-pay | Admitting: Internal Medicine

## 2013-11-15 ENCOUNTER — Emergency Department: Payer: Self-pay | Admitting: Student

## 2013-11-15 LAB — COMPREHENSIVE METABOLIC PANEL
ALBUMIN: 3.6 g/dL (ref 3.4–5.0)
ALK PHOS: 146 U/L — AB
ANION GAP: 7 (ref 7–16)
BUN: 25 mg/dL — ABNORMAL HIGH (ref 7–18)
Bilirubin,Total: 0.4 mg/dL (ref 0.2–1.0)
CHLORIDE: 105 mmol/L (ref 98–107)
CO2: 26 mmol/L (ref 21–32)
Calcium, Total: 8.7 mg/dL (ref 8.5–10.1)
Creatinine: 1.16 mg/dL (ref 0.60–1.30)
EGFR (African American): 58 — ABNORMAL LOW
GFR CALC NON AF AMER: 48 — AB
Glucose: 93 mg/dL (ref 65–99)
Osmolality: 280 (ref 275–301)
Potassium: 5.3 mmol/L — ABNORMAL HIGH (ref 3.5–5.1)
SGOT(AST): 25 U/L (ref 15–37)
SGPT (ALT): 20 U/L
SODIUM: 138 mmol/L (ref 136–145)
TOTAL PROTEIN: 6.3 g/dL — AB (ref 6.4–8.2)

## 2013-11-15 LAB — URINALYSIS, COMPLETE
BACTERIA: NONE SEEN
Bilirubin,UR: NEGATIVE
Blood: NEGATIVE
Glucose,UR: NEGATIVE mg/dL (ref 0–75)
KETONE: NEGATIVE
Leukocyte Esterase: NEGATIVE
Nitrite: NEGATIVE
PH: 7 (ref 4.5–8.0)
Protein: NEGATIVE
SPECIFIC GRAVITY: 1.013 (ref 1.003–1.030)
Squamous Epithelial: NONE SEEN
WBC UR: 1 /HPF (ref 0–5)

## 2013-11-15 LAB — CBC
HCT: 34.8 % — ABNORMAL LOW (ref 35.0–47.0)
HGB: 11.5 g/dL — ABNORMAL LOW (ref 12.0–16.0)
MCH: 31.7 pg (ref 26.0–34.0)
MCHC: 33.1 g/dL (ref 32.0–36.0)
MCV: 96 fL (ref 80–100)
Platelet: 170 10*3/uL (ref 150–440)
RBC: 3.63 10*6/uL — AB (ref 3.80–5.20)
RDW: 14.4 % (ref 11.5–14.5)
WBC: 7.1 10*3/uL (ref 3.6–11.0)

## 2013-11-15 LAB — TROPONIN I: Troponin-I: <0.02 ng/mL

## 2013-11-24 LAB — CBC WITH DIFFERENTIAL/PLATELET
BASOS ABS: 0 10*3/uL (ref 0.0–0.1)
BASOS PCT: 0.4 %
EOS ABS: 0.2 10*3/uL (ref 0.0–0.7)
Eosinophil %: 3.9 %
HCT: 32.8 % — ABNORMAL LOW (ref 35.0–47.0)
HGB: 10.8 g/dL — ABNORMAL LOW (ref 12.0–16.0)
LYMPHS PCT: 34.1 %
Lymphocyte #: 1.9 10*3/uL (ref 1.0–3.6)
MCH: 31.8 pg (ref 26.0–34.0)
MCHC: 32.9 g/dL (ref 32.0–36.0)
MCV: 97 fL (ref 80–100)
Monocyte #: 0.7 x10 3/mm (ref 0.2–0.9)
Monocyte %: 12 %
Neutrophil #: 2.7 10*3/uL (ref 1.4–6.5)
Neutrophil %: 49.6 %
PLATELETS: 176 10*3/uL (ref 150–440)
RBC: 3.39 10*6/uL — AB (ref 3.80–5.20)
RDW: 13.8 % (ref 11.5–14.5)
WBC: 5.5 10*3/uL (ref 3.6–11.0)

## 2013-11-24 LAB — BASIC METABOLIC PANEL
ANION GAP: 4 — AB (ref 7–16)
BUN: 24 mg/dL — ABNORMAL HIGH (ref 7–18)
CHLORIDE: 106 mmol/L (ref 98–107)
CO2: 32 mmol/L (ref 21–32)
Calcium, Total: 8.3 mg/dL — ABNORMAL LOW (ref 8.5–10.1)
Creatinine: 1.11 mg/dL (ref 0.60–1.30)
EGFR (African American): >60
GFR CALC NON AF AMER: 50 — AB
Glucose: 77 mg/dL (ref 65–99)
OSMOLALITY: 286 (ref 275–301)
Potassium: 4.3 mmol/L (ref 3.5–5.1)
SODIUM: 142 mmol/L (ref 136–145)

## 2013-12-13 ENCOUNTER — Encounter: Payer: Self-pay | Admitting: Internal Medicine

## 2014-01-12 ENCOUNTER — Encounter: Payer: Self-pay | Admitting: Internal Medicine

## 2014-02-12 ENCOUNTER — Encounter: Payer: Self-pay | Admitting: Internal Medicine

## 2014-02-12 ENCOUNTER — Ambulatory Visit
Admit: 2014-02-12 | Disposition: A | Discharge status: Home / Self Care | Payer: Self-pay | Attending: Nurse Practitioner | Admitting: Nurse Practitioner

## 2014-02-18 LAB — URINE CULTURE

## 2014-03-15 ENCOUNTER — Encounter: Payer: Self-pay | Admitting: Internal Medicine

## 2014-04-13 ENCOUNTER — Encounter
Admit: 2014-04-13 | Disposition: A | Discharge status: Home / Self Care | Payer: Self-pay | Attending: Internal Medicine | Admitting: Internal Medicine

## 2014-05-14 ENCOUNTER — Encounter
Admit: 2014-05-14 | Disposition: A | Discharge status: Home / Self Care | Payer: Self-pay | Attending: Internal Medicine | Admitting: Internal Medicine

## 2014-06-05 NOTE — H&P (Signed)
PATIENT NAME:  Gina Noble, NORTHWAY MR#:  [id number] DATE OF BIRTH:  [birth year]  DATE OF ADMISSION:  08/14/2013  PRIMARY CARE PHYSICIAN: Duane Lope. Judithann Sheen, MD  REFERRING PHYSICIAN: Charlestine Night. Scotty Court, MD   CHIEF COMPLAINT: Fall and hip fracture.   HISTORY OF PRESENT ILLNESS: The patient is an 81 year old pleasant Caucasian female with past medical history of hypertension, hyperlipidemia, COPD, who is presenting to the ED after she sustained a fall. The patient was getting out of the bed to go to bathroom in the middle of the night, and then while using walker, she tripped over and lost her balance. The patient fell on the right side, and she was having right hip pain. The patient was brought into the ED and diagnosed with right hip fracture. The patient was made n.p.o., and hospitalist team is called to admit the patient regarding right hip fracture. The ER physician has paged and discussed with Dr. Ernest Pine, the on-call orthopedic. During my examination, the patient is complaining of right hip pain, and morphine was given. Two daughters are at bedside. The patient has 1 daughter who lives in [place] and is visiting the patient, and she is with her tonight. The patient usually lives alone. While in the ED, the EKG has revealed sinus bradycardia, and on the telemetry monitor, several PVCs were noted. The patient denies any palpitations or dizziness. She did not lose any consciousness when she sustained this fall. Her 2 daughters are at bedside. No other complaints.   PAST MEDICAL HISTORY: 1. Hypertension.  2. Hyperlipidemia.  3. Chronic history of CPD.  4. History of CVA x3.  5. Anxiety.  6. Depression.  7. Osteoarthritis.   PAST SURGICAL HISTORY: Left hip fracture status post repair.   ALLERGIES: THE PATIENT IS ALLERGIC TO PENICILLIN, DILANTIN, KEPPRA.   PSYCHOSOCIAL HISTORY: Lives at home, lives alone. She used to smoke, but quit smoking several years ago. She also used to drink alcohol heavily, but  she quit drinking. Denies any illicit drug usage.   FAMILY HISTORY: Mother had history of congestive heart failure.   HOME MEDICATIONS:  1. Venlafaxine extended release 1 capsule p.o. once daily.  2. Pantoprazole 40 mg p.o. once daily.  3. Mirtazapine 15 mg p.o. once a day at bedtime.  4. Metoprolol tartrate 50 mg 1/2 tablet p.o. b.i.d.  5. Lorazepam 0.5 mg p.o. q.8 hours.  6. Lamotrigine 25 mg 3 tablets p.o. once a day.  7. Plavix 75 mg p.o. once daily. 8. Benadryl 25 mg p.o. once daily. 9. Aspirin 81 mg p.o. once daily.  10. Advair 1 puff inhalation 2 times a day.   REVIEW OF SYSTEMS:  CONSTITUTIONAL: Denies any fever or fatigue.  EYES: Denies blurry vision, double vision.  ENT: Denies epistaxis, discharge, tinnitus.  RESPIRATION: Denies cough. Has chronic history of COPD.  CARDIOVASCULAR: Denies any chest pain, shortness of breath or palpitations.  GASTROINTESTINAL: Denies nausea, vomiting, diarrhea, abdominal pain, hematemesis or melena.  GENITOURINARY: No dysuria or hematuria. ENDOCRINE: Denies polyuria, nocturia, thyroid problems.  HEMATOLOGIC AND LYMPHATIC: No anemia, easy bruising or bleeding.  INTEGUMENTARY: No acne, rash, lesions.  MUSCULOSKELETAL: Complaining of right hip pain. Denies any other joint pains. No shoulder pain. No neck pain. Denies any gout.  NEUROLOGIC: Has 3 episodes of stroke in the past. Denies any ataxia. PSYCHIATRIC: Has chronic history of anxiety and depression.   PHYSICAL EXAMINATION:  VITAL SIGNS: Temperature 97.4, pulse 49, respirations 16, blood pressure 104/54, pulse oximetry 98%.  GENERAL APPEARANCE: Not under acute  distress. Moderately built and nourished.  HEENT: Normocephalic, atraumatic. Pupils are equally reacting to light and accommodation. No scleral icterus. No conjunctival injection. No sinus tenderness. No postnasal drip. Moist mucous membranes.  NECK: Supple. No JVD. No thyromegaly. Range of motion is intact.  LUNGS: Clear to  auscultation bilaterally. No accessory muscle usage. No anterior chest wall tenderness on palpation.  CARDIAC: S1 and S2 normal. Regular rate and rhythm. No murmurs.  GASTROINTESTINAL: Soft. Bowel sounds are positive in all 4 quadrants. Nontender, nondistended. No hepatosplenomegaly. No masses felt.  NEUROLOGIC: Awake, alert and oriented x3. Cranial nerves II through XII are grossly intact. Motor and sensory are intact except in the right hip area. Reflexes are 2+. EXTREMITIES: Right hip is abducted and externally rotated, tender to touch. Range of motion is limited because of the fracture. Other joints are intact.  EXTREMITIES: No edema. No cyanosis. No clubbing.  SKIN: Warm to touch. Normal turgor. No rashes. No lesions.  PSYCHIATRIC: Flat mood and affect.   LABORATORY AND IMAGING STUDIES: Chest x-ray, single view: No active disease, chronic cardiomegaly and left diaphragm elevation. Right hip x-ray: Acute intertrochanteric right femur fracture. A 12-lead EKG: Normal sinus rhythm with sinus arrhythmia at 50 beats per minute. No acute ST wave changes. Telemetry strip: Multiple PVCs were noticed. LFTs are normal except total protein is 6.3. WBC 5.1, hemoglobin 10.3, hematocrit is 29.5, platelets are 171, MCV 92. Glucose 119, BUN is normal, creatinine 1.34, sodium 133, potassium 4.1, chloride 98, CO2 27, GFR is 37, anion gap is normal. Serum osmolality 268, calcium is 8.3, magnesium is 1.8. TSH is ordered, which is pending.    ASSESSMENT AND PLAN: An 81 year old female who lives alone, who has sustained a fall while she was trying to go out of the bed as she lost her balance, sustained right hip fracture. In the ED, the patient with multiple premature ventricular contractions on the telemetry strip, and EKG has revealed sinus bradycardia. Cardiology is consulted regarding preoperative clearance. This is discussed with Dr. Ernest PineHooten, who is aware.   1. Fall with right hip fracture. Will make her n.p.o.  Provide her IV fluids. Pain management with morphine. [facility]iology [facility] for preop clearance in view of multiple premature ventricular contractions and sinus bradycardia.  2. Frequent premature ventricular contractions and bradycardia. Magnesium is normal. TSH is ordered, which is pending. [facility]iology [facility] for preop clearance.  3. Acute kidney injury. Provide IV fluids. The patient is currently n.p.o.  4. Hypertension and hyperlipidemia. Hold off on home medications as the patient is n.p.o. Will provide her antihypertensive as needed for elevated blood pressure.  5. Chronic obstructive pulmonary disease. Provide nebulizer treatments.  6. Will provide gastrointestinal prophylaxis and deep vein thrombosis prophylaxis with SCDs.   CODE STATUS: The patient is DNR. Daughters are the medical power of attorney.   Plan of care discussed with the patient and 2 daughters at bedside. They all verbalized understanding of the plan.   The patient will be transferred to Dr. Aletha HalimJeff Sparks.   TOTAL TIME SPENT ON THE ADMISSION: 50 minutes.   ____________________________ Ramonita LabAruna Carma Dwiggins, MD ag:lb D: 08/14/2013 08:01:59 ET T: 08/14/2013 08:58:47 ET JOB#: [id number]  cc: Ramonita LabAruna Abrial Arrighi, MD, <Dictator> Duane LopeJeffrey D. Judithann SheenSparks, MD Ramonita LabARUNA Trynity Skousen MD ELECTRONICALLY SIGNED 08/15/2013 0:45

## 2014-06-05 NOTE — Discharge Summary (Signed)
PATIENT NAME:  Gina Noble, Gina Noble MR#:  [id number] DATE OF BIRTH:  06-24-1928  DISCHARGE AND TRANSFER SUMMARY  DATE OF ADMISSION:  08/14/2013 DATE OF DISCHARGE:  08/20/2013   TYPE OF DISCHARGE: The patient transferred to a skilled nursing facility.   REASON FOR ADMISSION: Right hip fracture.   HISTORY OF PRESENT ILLNESS: The patient is an 81 year old female with a history of alcoholism, previous strokes, COPD and chronic dysphagia. She presented to the Emergency Room after a fall, where she was found to have a right hip fracture. She was admitted for further evaluation.   PAST MEDICAL HISTORY:  1. COPD.  2. Previous stroke.  3. History of alcohol abuse.  4. Chronic dysphagia.  5. Benign hypertension.  6. Hyperlipidemia.  7. Anxiety/depression.  8. Osteoarthritis.   MEDICATIONS ON ADMISSION: Please see admission note.   ALLERGIES: DILANTIN, KEPPRA AND PENICILLIN.   SOCIAL HISTORY, FAMILY HISTORY AND REVIEW OF SYSTEMS: As per admission note.   PHYSICAL EXAMINATION:  GENERAL: The patient was in no acute distress.  VITAL SIGNS: Stable, and she was afebrile.  HEENT: Unremarkable.  NECK: Supple without JVD.  LUNGS: Clear.  CARDIAC: Revealed a regular rate and rhythm with normal S1, S2.  ABDOMEN: Soft and nontender.  EXTREMITIES: Without edema.  NEUROLOGIC: Grossly nonfocal.   HOSPITAL COURSE: The patient was admitted with a fall and right hip fracture. She was seen by orthopedics, who recommended surgery for repair. This was done. Postoperatively, the patient developed significant anemia with a hemoglobin of 6. She was hypotensive and tachycardic. She was moved to the Intensive Care Unit, where she was transfused. Her hemoglobin improved as did her tachycardia and hypotension. There was no evidence of further bleeding. She was moved back to the orthopedics floor from the Intensive Care Unit. She was seen by physical therapy, who recommended skilled nursing placement for rehab. The patient  and family agreed. She is now transferred to the skilled nursing facility for further care and rehabilitation.   DISCHARGE DIAGNOSES:  1. Acute right hip fracture, status post surgical repair.  2. Acute blood loss anemia.  3. Hypotension, resolved.  4. Tachycardia, resolved.  5. Chronic obstructive pulmonary disease.  6. Chronic dysphagia.  7. Previous stroke x3.  8. History of alcoholism.   DISCHARGE MEDICATIONS:  1. DuoNeb SVNs q.i.d.  2. Oxygen at 2 liters per minute per nasal cannula.  3. Lopressor 25 mg p.o. b.i.d.  4. Effexor-XR 75 mg p.o. daily.  5. Tramadol 50 mg p.o. q.4 hours p.r.n. pain.  6. Protonix 40 mg p.o. b.i.d.  7. Iron sulfate 325 mg p.o. b.i.d.  8. Colace 100 mg p.o. b.i.d.  9. Benadryl 25 mg p.o. at bedtime.  10. Advair 250/50 one puff b.i.d. 11. Lamictal 75 mg p.o. at bedtime.  12. Ativan 0.5 mg p.o. q.8 hours p.r.n. anxiety. 13. Remeron 15 mg p.o. at bedtime.  14. Oxycodone 5 mg p.o. q.4 hours p.r.n. pain.   FOLLOWUP PLANS AND APPOINTMENTS: The patient will be followed by the resident physician at the skilled nursing facility. She is a no code blue, do not resuscitate. She is on a regular diet with Ensure supplements t.i.d. She will be seen in consultation by physical therapy. Will obtain a CBC and a MET-B in 1 week.   ____________________________ Leonie Douglas. Doy Hutching, MD jds:lb D: 08/20/2013 07:22:20 ET T: 08/20/2013 07:28:12 ET JOB#: [id number]  cc: Leonie Douglas. Doy Hutching, MD, <Dictator> Juwaun Inskeep Lennice Sites MD ELECTRONICALLY SIGNED 08/21/2013 12:11

## 2014-06-05 NOTE — Consult Note (Signed)
PATIENT NAME:  Gina Noble, Breunna F MR#:  [id number] DATE OF BIRTH:  February 11, 1929  DATE OF CONSULTATION:  08/14/2013  REFERRING PHYSICIAN:  Dr. Rosita KeaMenz. CONSULTING PHYSICIAN:  Lamar BlinksBruce J. Kowalski, MD  REASON FOR CONSULTATION: Preventricular contractions and bradycardia with previous cardiovascular history including stroke.   CHIEF COMPLAINT: The patient is aphasic, but did have a hip fracture.  HISTORY OF PRESENT ILLNESS: This is an 81 year old female with multiple strokes and aphasia who has had appropriate care and no current and recent evidence of congestive heart failure, angina or other significant issues. The patient has had appropriate medication management for hypertension and has had metoprolol. At that time, the patient did have some bradycardia, likely secondary to metoprolol with an EKG showing sinus bradycardia with incomplete right bundle branch block without evidence of other significant symptoms. The patient appears to be at low risk for cardiovascular complication with surgery. She has had some preventricular contractions with admission to the hospital, likely secondary to pain of her current issues, but has resolved at this time with telemetry. The patient has had multiple strokes, on appropriate medications including Plavix and aspirin, and stable at this time. She does have a history of hyperlipidemia and had not been on medication due to side effects. The patient does have anemia with hemoglobin of 10.6. Remainder of review of systems cannot be assessed due to significant aphasia.   PAST MEDICAL HISTORY:  1.  Strokes.  2.  Bradycardia.  3.  Hypertension.  4.  Hyperlipidemia.  5.  Anemia.   FAMILY HISTORY: No family members with significant cardiovascular risk factors for myocardial infarction.   SOCIAL HISTORY: The patient does not have any alcohol or tobacco use.   ALLERGIES: AS LISTED.   MEDICATIONS: As listed.   PHYSICAL EXAMINATION:  VITAL SIGNS: Blood pressure is 122/68  bilaterally, heart rate 72 upright, reclining, and regular.  GENERAL: She is a well-appearing female, aphasic.  HEAD, EYES, EARS, NOSE, AND THROAT: No icterus, thyromegaly, ulcers, hemorrhage, or xanthelasma.  CARDIOVASCULAR: Regular rate and rhythm. Normal S1 and S2 without murmur, gallop, or rub. PMI is normal size and placement. Carotid upstroke normal without bruit. Jugular venous pressure is normal.  LUNGS: Have a few expiratory wheezes.  ABDOMEN: Soft, nontender without hepatosplenomegaly or masses. Abdominal aorta is normal size without bruit.  EXTREMITIES: 2+ bilateral pulses in dorsal, pedal, radial and femoral arteries with trace lower extremity edema. No cyanosis, clubbing, or ulcers.  NEUROLOGIC: She has had some strokes.   ASSESSMENT: An 81 year old female with hypertension and bradycardia, preventricular contractions, previous vascular issues with strokes, hyperlipidemia, anemia relatively stable with no current evidence of congestive heart failure or anginal symptoms on appropriate medication management at lowest risk possible for cardiovascular complication with surgical intervention.   RECOMMENDATIONS:  1.  Continue metoprolol at low dose throughout the surgery, watching for worsening bradycardia, but will help with hypertension control.  2.  the patient may have discontinuation of Plavix to reduce bleeding complication with surgical intervention, but would remained on aspirin for further risk reduction in stroke.   3.  Continue to follow closely for any blood loss for concerns of anemia.   4.  No additional lipid management due to side effects in the past.   5.  No further concerns of preventricular contractions, which appear to be benign and likely secondary to pain.  6.  No restrictions to rehabilitation and/or postsurgical care.    ____________________________ Lamar BlinksBruce J. Kowalski, MD bjk:ts D: 08/14/2013 09:02:20 ET T: 08/14/2013 11:01:14 ET  JOB#: [id number]  cc: Lamar Blinks, MD, <Dictator> Lamar Blinks MD ELECTRONICALLY SIGNED 08/17/2013 13:22

## 2014-06-05 NOTE — Consult Note (Signed)
   Comments   I met with pt's daughters. They remain hopeful that pt can recover from her acute illness and go to SNF for STR. They are also hopeful that pt can return to her home after STR. Pt has a sitter 3 hrs a day and son and daughter see her daily.  discussed code status with daughters and they want pt to be a DNR. Order entered. Out-of-facility DNR completed and placed in chart.   Electronic Signatures: Gaytha Raybourn, Izora Gala (MD)  (Signed 06-Jul-15 12:48)  Authored: Palliative Care   Last Updated: 06-Jul-15 12:48 by Nima Kemppainen, Izora Gala (MD)

## 2014-06-05 NOTE — Consult Note (Signed)
Brief Consult Note: Diagnosis: right pertrochanteric femur fracture.   Patient was seen by consultant.   Comments: Recommend ORIF of right pertrochanteric femur fracture.  The risks and benefits of surgical intervention were discussed in detail with the patient. The patient expressed understanding of the risks and benefits and agreed with plans for surgery.   Surgical site signed as per "right site surgery" protocol.  Electronic Signatures: Donato HeinzHooten, James P (MD)  (Signed 03-Jul-15 09:53)  Authored: Brief Consult Note   Last Updated: 03-Jul-15 09:53 by Donato HeinzHooten, James P (MD)

## 2014-06-05 NOTE — Op Note (Signed)
PATIENT NAME:  Gina Noble, BRESS MR#:  [id number] DATE OF BIRTH:  [birth year]  DATE OF PROCEDURE:  08/14/2013  PREOPERATIVE DIAGNOSIS: Right peritrochanteric femur fracture.   POSTOPERATIVE DIAGNOSIS: Right peritrochanteric femur fracture.   PROCEDURE PERFORMED: Open reduction and internal fixation of right peritrochanteric femur fracture.   SURGEON: Francesco Sor, MD.   ANESTHESIA: General.   ESTIMATED BLOOD LOSS: 200 mL.   FLUIDS REPLACED: 1500 mL of crystalloid.   DRAINS: None.   IMPLANTS UTILIZED: Synthes 130 degrees 11 x 340 mm trochanteric fixation nail, 95 mm helical blade, and 5 x 42 mm locking bolt.   INDICATIONS FOR SURGERY: The patient is an 81 year old female who fell out of bed on the date of admission and landed on her right hip and side. She was unable to stand or bear weight due to the pain. X-rays demonstrated a comminuted right peritrochanteric femur fracture. The patient was cleared for surgery by internal medicine and cardiology. After discussion of the risks and benefits of surgical intervention, the patient expressed understanding of the risks, benefits, and agreed with plans for surgical intervention.   PROCEDURE IN DETAIL: The patient was brought into Operating Room and, after adequate general anesthesia was achieved, the patient was transferred to fracture table. Traction was applied to the right lower extremity and provisional closed reduction was performed with acceptable position noted in both AP and lateral planes using the C-arm. All bony prominences were well padded. The patient's right hip and leg were cleaned and prepped with alcohol and DuraPrep and draped in the usual sterile fashion. A "timeout" was performed as per usual protocol. A lateral incision was made extending from the tip of the trochanter proximally. Fascia was incised in line with the skin incision, and dissection was carried down to the tip of the greater trochanter. A distally threaded guide pin was  inserted through the greater trochanter into the femoral canal. Position was confirmed in multiple planes using the C-arm. Scepter was used to enlarge the entry site.  The small guidewire was then replaced with long beaded guidewire and position was confirmed down the canal to the superior pole of the patella. Measurements were obtained and it was felt that a 340 mm length was appropriate. The canal was reamed in a sequential fashion up to a 12 mm diameter. A 130 degrees 11 x 340 mm trochanteric fixation nail was then advanced over the guidewire and position was confirmed. Guidewire was removed. A second incision was made along the lateral aspect of the thigh and the tissue protector was inserted through the outrigger device and advanced to the lateral cortex. A distally threaded guide pin was inserted into the femoral neck and head with good position noted in both AP and lateral planes using the C-arm. Measurements were obtained and it was felt that a 95 mm helical blade was appropriate length. Outer cortex was reamed followed by advancement of a cannulated reamer over the guidewire. A 95 mm helical blade was then impacted over the guidewire with reasonably good purchase noted. Guidewire was removed. The locking sleeve was engaged proximally. Next, the C-arm was positioned so as to visualize the distal aspect of the nail and lateral plane so as to visualize the locking sites. A 5.0 x 42 mm locking bolt was inserted through the slotted  site. Outrigger device was removed and the implant was visualized from proximally to distally with good position noted and good reduction of the fracture site. The wounds were irrigated with copious amounts of normal  saline with antibiotic solution and then suctioned dry. Hemostasis was achieved using electrocautery. Fascia was closed using interrupted sutures of #1 Vicryl. The subcutaneous tissue was approximated in layers using first #0 Vicryl followed by 2-0 Vicryl. Skin was  closed with skin staples. A sterile dressing was applied. The patient tolerated the procedure well. She was transported to the recovery room in stable condition.      ____________________________ Illene LabradorJames P. Angie FavaHooten Jr., MD jph:ts D: 08/15/2013 09:31:18 ET T: 08/15/2013 14:45:21 ET JOB#: [id number]  cc: Fayrene FearingJames P. Angie FavaHooten Jr., MD, <Dictator> JAMES P Angie FavaHOOTEN JR MD ELECTRONICALLY SIGNED 08/16/2013 23:09

## 2014-07-14 ENCOUNTER — Encounter
Admission: RE | Admit: 2014-07-14 | Discharge: 2014-07-14 | Disposition: A | Discharge status: Home / Self Care | Payer: Medicare Other | Source: Ambulatory Visit | Attending: Internal Medicine | Admitting: Internal Medicine

## 2014-08-13 ENCOUNTER — Encounter
Admission: RE | Admit: 2014-08-13 | Discharge: 2014-08-13 | Disposition: A | Discharge status: Home / Self Care | Payer: Medicare Other | Source: Ambulatory Visit | Attending: Internal Medicine | Admitting: Internal Medicine

## 2014-09-13 ENCOUNTER — Encounter
Admission: RE | Admit: 2014-09-13 | Discharge: 2014-09-13 | Disposition: A | Discharge status: Home / Self Care | Payer: Medicare Other | Source: Ambulatory Visit | Attending: Internal Medicine | Admitting: Internal Medicine

## 2014-10-14 DEATH — deceased

## 2016-02-01 IMAGING — CR DG CHEST 1V
1 series · 1 of 1 positions shown · non-contrast
Comparison: 09/08/2010

CLINICAL DATA: Right hip pain after fall.  Preoperative exam.

EXAM:
CHEST - 1 VIEW

[t chest supine]
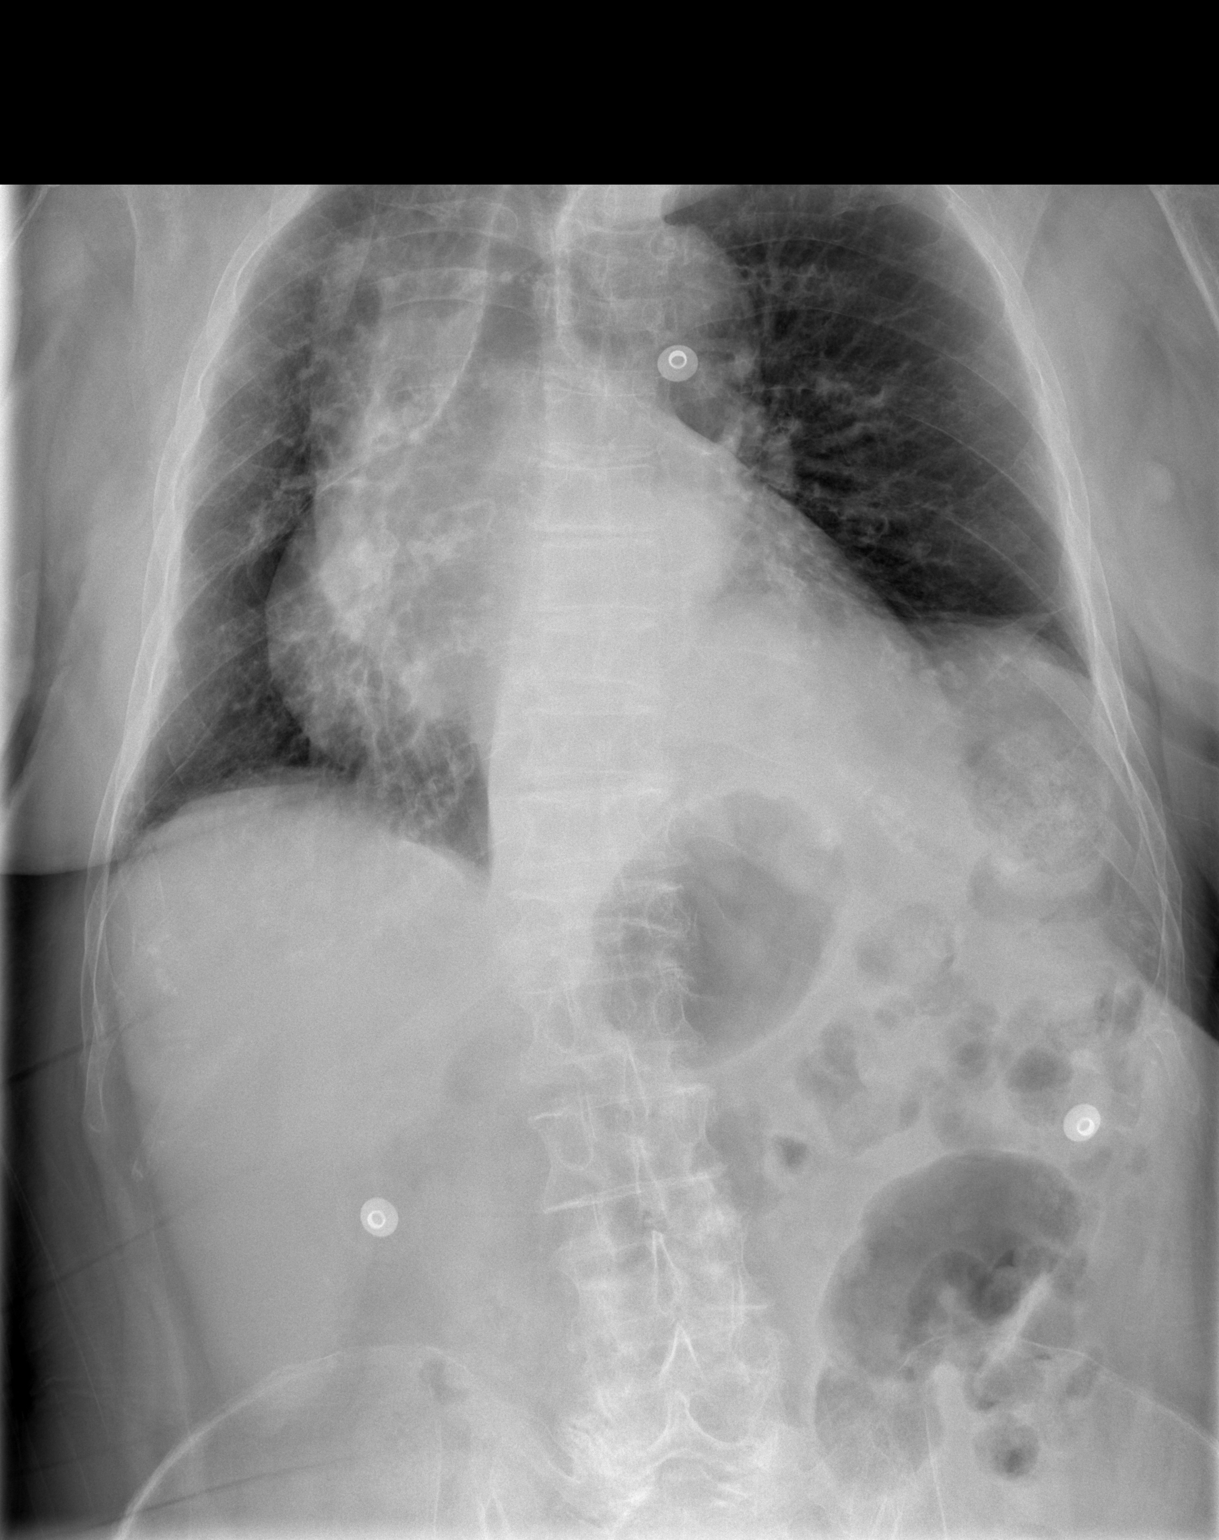

[1 of 1 positions shown; findings below may reference images not displayed]

FINDINGS: Chronic cardiomegaly and aortic tortuosity. Prominence of the upper
right mediastinum is likely from portable technique, hypoaeration,
and rotation. There is chronic elevation of the left diaphragm.
There is no evidence of edema, consolidation, effusion, or
pneumothorax. Wedging of lower thoracic vertebral bodies, maximal at
T12, is likely stable from lateral radiography 09/08/2010.
IMPRESSION: 1. No active disease.
2. Chronic cardiomegaly and left diaphragmatic elevation.

## 2016-02-01 IMAGING — CR DG FEMUR 2V*R*
1 series · 4 of 4 positions shown · non-contrast
Comparison: 08/14/2013

CLINICAL DATA: Postop

EXAM:
RIGHT FEMUR - 2 VIEW

[Series 1: ap · 0.17mm/px · 4 of 4 slices shown]
[im 1/4]
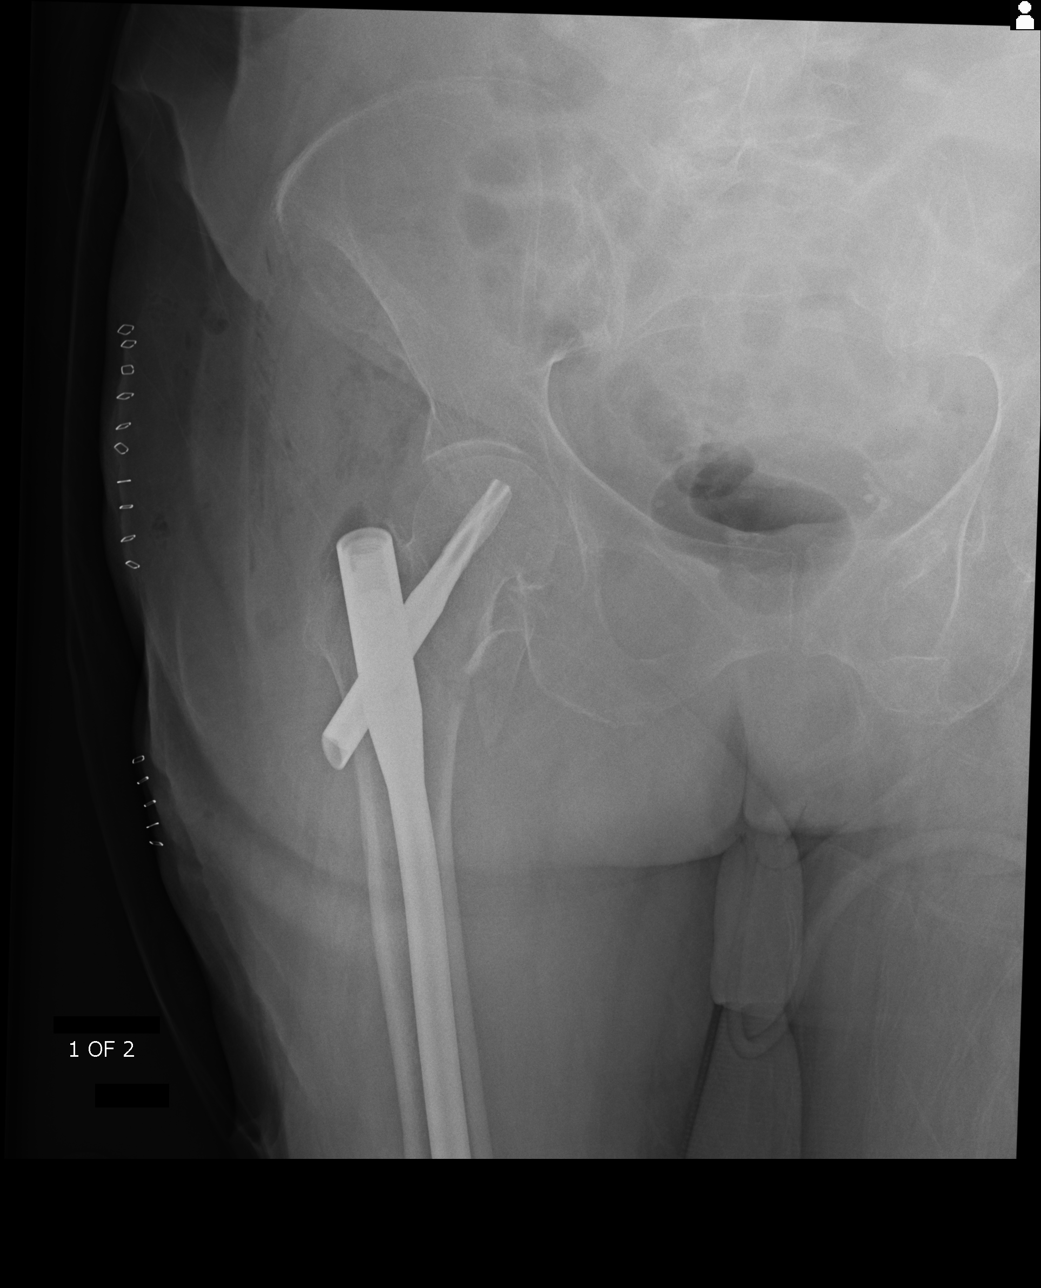
[im 2/4]
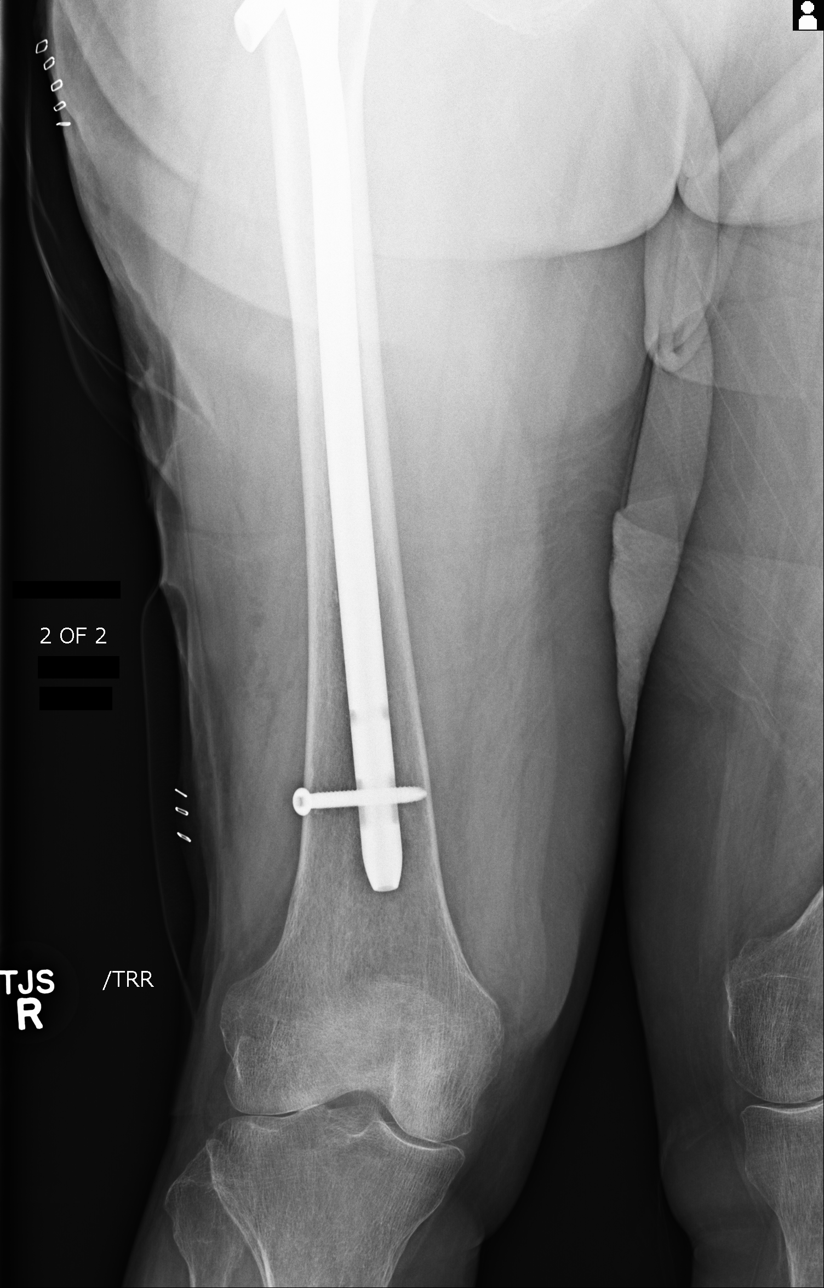
[im 3/4]
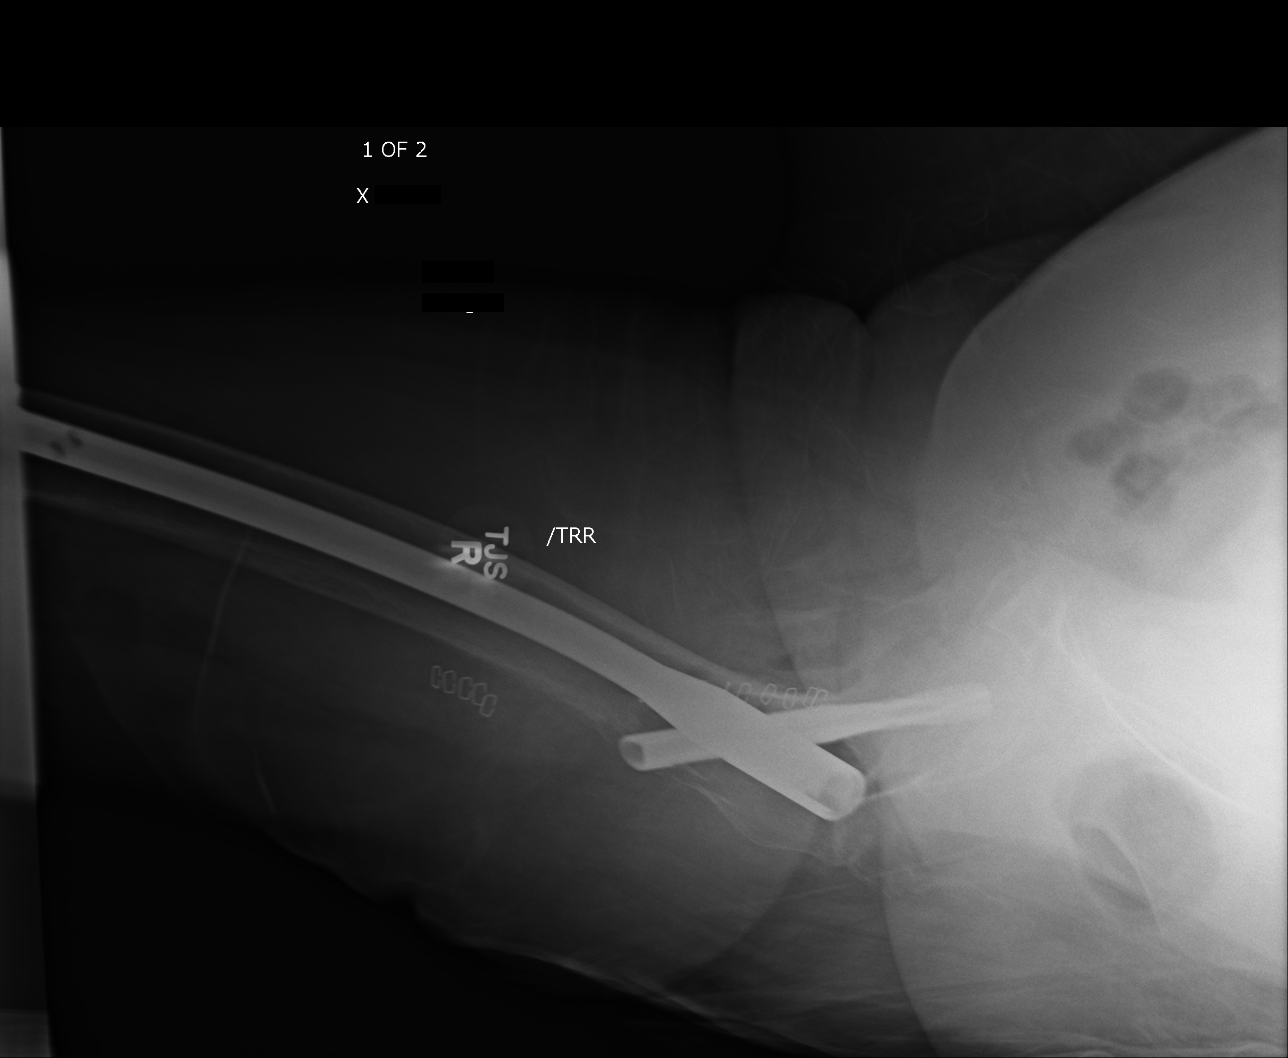
[im 4/4]
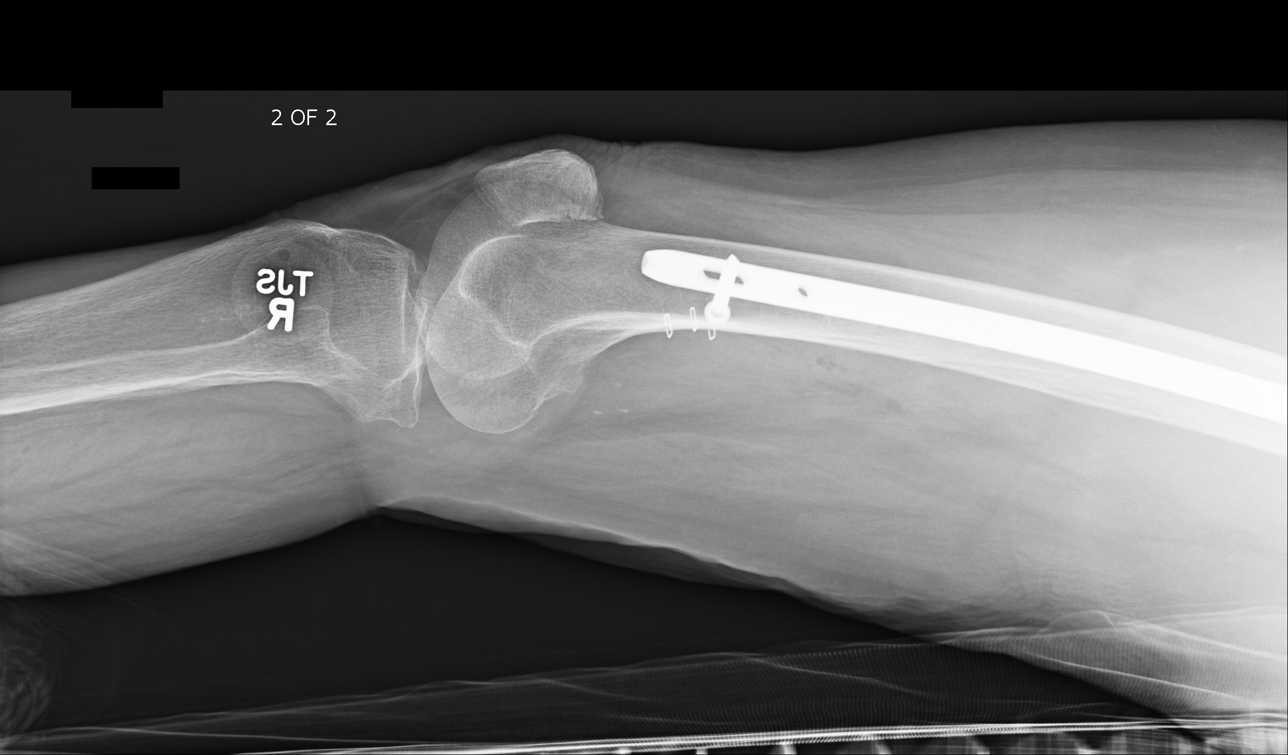

[4 of 4 positions shown; findings below may reference images not displayed]

FINDINGS: Interval internal fixation with dynamic hip screw and intra
medullary rod. Single locking distal screw in the distal femur.
Improved alignment across the intertrochanteric fracture. The lesser
trochanter remains displaced.
IMPRESSION: Internal fixation across the right intertrochanteric fracture as
above.
